# Patient Record
Sex: Female | Born: 1990 | Hispanic: Yes | Marital: Married | Smoking: Former smoker
Health system: Southern US, Community
[De-identification: ages and names within clinical notes are randomized; demographics above are authoritative.]

## PROBLEM LIST (undated history)

## (undated) DIAGNOSIS — E669 Obesity, unspecified: Secondary | ICD-10-CM

## (undated) DIAGNOSIS — E66811 Obesity, class 1: Secondary | ICD-10-CM

## (undated) DIAGNOSIS — R87629 Unspecified abnormal cytological findings in specimens from vagina: Secondary | ICD-10-CM

## (undated) DIAGNOSIS — R569 Unspecified convulsions: Secondary | ICD-10-CM

## (undated) DIAGNOSIS — K8020 Calculus of gallbladder without cholecystitis without obstruction: Secondary | ICD-10-CM

## (undated) HISTORY — PX: NASAL SEPTUM SURGERY: SHX37

## (undated) HISTORY — DX: Unspecified abnormal cytological findings in specimens from vagina: R87.629

## (undated) HISTORY — DX: Unspecified convulsions: R56.9

---
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
## 2011-04-21 HISTORY — PX: NASAL SEPTUM SURGERY: SHX37

## 2015-02-27 LAB — HM PAP SMEAR: HM Pap smear: NEGATIVE

## 2015-03-21 DIAGNOSIS — Z3402 Encounter for supervision of normal first pregnancy, second trimester: Secondary | ICD-10-CM | POA: Insufficient documentation

## 2015-03-21 DIAGNOSIS — Z8669 Personal history of other diseases of the nervous system and sense organs: Secondary | ICD-10-CM | POA: Insufficient documentation

## 2015-06-12 LAB — HM HIV SCREENING LAB: HM HIV Screening: NEGATIVE

## 2015-12-20 ENCOUNTER — Inpatient Hospital Stay: Admit: 2015-12-20 | Payer: Self-pay

## 2018-10-11 ENCOUNTER — Emergency Department
Admission: EM | Admit: 2018-10-11 | Discharge: 2018-10-11 | Disposition: A | Discharge status: Home / Self Care | Payer: HRSA Program | Attending: Emergency Medicine | Admitting: Emergency Medicine

## 2018-10-11 ENCOUNTER — Other Ambulatory Visit: Payer: Self-pay

## 2018-10-11 ENCOUNTER — Emergency Department: Payer: HRSA Program

## 2018-10-11 ENCOUNTER — Encounter: Payer: Self-pay | Admitting: Emergency Medicine

## 2018-10-11 DIAGNOSIS — R059 Cough, unspecified: Secondary | ICD-10-CM

## 2018-10-11 DIAGNOSIS — R05 Cough: Secondary | ICD-10-CM

## 2018-10-11 DIAGNOSIS — R0600 Dyspnea, unspecified: Secondary | ICD-10-CM

## 2018-10-11 DIAGNOSIS — R0602 Shortness of breath: Secondary | ICD-10-CM | POA: Diagnosis present

## 2018-10-11 DIAGNOSIS — U071 COVID-19: Secondary | ICD-10-CM | POA: Insufficient documentation

## 2018-10-11 LAB — BASIC METABOLIC PANEL
Anion gap: 7 (ref 5–15)
BUN: 11 mg/dL (ref 6–20)
CO2: 24 mmol/L (ref 22–32)
Calcium: 8.8 mg/dL — ABNORMAL LOW (ref 8.9–10.3)
Chloride: 110 mmol/L (ref 98–111)
Creatinine, Ser: 0.65 mg/dL (ref 0.44–1.00)
GFR calc Af Amer: >60 mL/min (ref >60)
GFR calc non Af Amer: >60 mL/min (ref >60)
Glucose, Bld: 106 mg/dL — ABNORMAL HIGH (ref 70–99)
Potassium: 3.8 mmol/L (ref 3.5–5.1)
Sodium: 141 mmol/L (ref 135–145)

## 2018-10-11 LAB — CBC
HCT: 39 % (ref 36.0–46.0)
Hemoglobin: 12.9 g/dL (ref 12.0–15.0)
MCH: 29.9 pg (ref 26.0–34.0)
MCHC: 33.1 g/dL (ref 30.0–36.0)
MCV: 90.3 fL (ref 80.0–100.0)
Platelets: 238 10*3/uL (ref 150–400)
RBC: 4.32 MIL/uL (ref 3.87–5.11)
RDW: 13.2 % (ref 11.5–15.5)
WBC: 5.5 10*3/uL (ref 4.0–10.5)
nRBC: 0 % (ref 0.0–0.2)

## 2018-10-11 LAB — TROPONIN I (HIGH SENSITIVITY): Troponin I (High Sensitivity): <2 ng/L (ref <18)

## 2018-10-11 MED ORDER — SODIUM CHLORIDE 0.9% FLUSH: 3.0000 mL | Freq: Once | INTRAVENOUS | Status: DC

## 2018-10-11 NOTE — ED Provider Notes (Signed)
[facility] Emergency Department Provider Note       Time seen: ----------------------------------------- 10:00 PM on 10/11/2018 -----------------------------------------   I have reviewed the triage vital signs and the nursing notes.  HISTORY   Chief Complaint Shortness of Breath, Cough, and Exposure to COVID-19    HPI Danielle Ball is a 28 y.o. female with no significant past medical history who presents to the ED for chest pressure for the last week and shortness of breath started yesterday.  Patient reports dry cough and other cold symptoms that started yesterday.  She states her husband was tested for COVID and received a positive result on Thursday but has been isolated since him since the positive result.  History reviewed. No pertinent past medical history.  There are no active problems to display for this patient.   Past Surgical History:  Procedure Laterality Date  . NASAL SEPTUM SURGERY      Allergies Metoclopramide  Social History Social History   Tobacco Use  . Smoking status: Never Smoker  . Smokeless tobacco: Never Used  Substance Use Topics  . Alcohol use: Not on file  . Drug use: Not on file   Review of Systems Constitutional: Negative for fever. Cardiovascular: Positive for chest pain Respiratory: Positive for shortness of breath, cough Gastrointestinal: Negative for abdominal pain, vomiting and diarrhea. Musculoskeletal: Negative for back pain. Skin: Negative for rash. Neurological: Negative for headaches, focal weakness or numbness.  All systems negative/normal/unremarkable except as stated in the HPI  ____________________________________________   PHYSICAL EXAM:  VITAL SIGNS: ED Triage Vitals  Enc Vitals Group     BP 10/11/18 1905 114/66     Pulse Rate 10/11/18 1905 95     Resp 10/11/18 1905 18     Temp 10/11/18 1905 97.9 F (36.6 C)     Temp Source 10/11/18 1905 Oral     SpO2 10/11/18 1905 100  %     Weight 10/11/18 1919 132 lb 4.4 oz (60 kg)     Height 10/11/18 1919 4' 11.84" (1.52 m)     Head Circumference --      Peak Flow --      Pain Score 10/11/18 1913 4     Pain Loc --      Pain Edu? --      Excl. in GC? --    Constitutional: Alert and oriented. Well appearing and in no distress. Eyes: Conjunctivae are normal. Normal extraocular movements. ENT      Head: Normocephalic and atraumatic.      Nose: No congestion/rhinnorhea.      Mouth/Throat: Mucous membranes are moist.      Neck: No stridor. Cardiovascular: Normal rate, regular rhythm. No murmurs, rubs, or gallops. Respiratory: Normal respiratory effort without tachypnea nor retractions. Breath sounds are clear and equal bilaterally. No wheezes/rales/rhonchi. Gastrointestinal: Soft and nontender. Normal bowel sounds Musculoskeletal: Nontender with normal range of motion in extremities. No lower extremity tenderness nor edema. Neurologic:  Normal speech and language. No gross focal neurologic deficits are appreciated.  Skin:  Skin is warm, dry and intact. No rash noted. Psychiatric: Mood and affect are normal. Speech and behavior are normal.  ____________________________________________  EKG: Interpreted by me.  Sinus rhythm the rate of 77 bpm, normal PR interval, normal QRS, normal QT  ____________________________________________  ED COURSE:  As part of my medical decision making, I reviewed the following data within the electronic MEDICAL RECORD NUMBER History obtained from family if available, nursing notes, old chart  and ekg, as well as notes from prior ED visits. Patient presented for chest pain and shortness of breath, we will assess with labs and imaging as indicated at this time.   Procedures  Yarima L Classie Weng was evaluated in Emergency Department on 10/11/2018 for the symptoms described in the history of present illness. She was evaluated in the context of the global COVID-19 pandemic, which necessitated  consideration that the patient might be at risk for infection with the SARS-CoV-2 virus that causes COVID-19. Institutional protocols and algorithms that pertain to the evaluation of patients at risk for COVID-19 are in a state of rapid change based on information released by regulatory bodies including the CDC and federal and state organizations. These policies and algorithms were followed during the patient's care in the ED.  ____________________________________________   LABS (pertinent positives/negatives)  Labs Reviewed  BASIC METABOLIC PANEL - Abnormal; Notable for the following components:      Result Value   Glucose, Bld 106 (*)    Calcium 8.8 (*)    All other components within normal limits  NOVEL CORONAVIRUS, NAA (HOSPITAL ORDER, SEND-OUT TO REF LAB)  CBC  TROPONIN I (HIGH SENSITIVITY)  TROPONIN I (HIGH SENSITIVITY)  POC URINE PREG, ED    RADIOLOGY Images were viewed by me Chest x-ray Did not reveal any acute process  ____________________________________________   DIFFERENTIAL DIAGNOSIS   Coronavirus, viral illness, pneumonia, anxiety, seasonal allergy  FINAL ASSESSMENT AND PLAN  Chest pain, shortness of breath   Plan: The patient had presented for likely coronavirus as her husband is COVID-19 positive. Patient's labs are normal, we have sent in the outpatient coronavirus test. Patient's imaging was reassuring.  She is cleared for outpatient follow-up.   Laurence Aly, MD    Note: This note was generated in part or whole with voice recognition software. Voice recognition is usually quite accurate but there are transcription errors that can and very often do occur. I apologize for any typographical errors that were not detected and corrected.     Earleen Newport, MD 10/11/18 2236

## 2018-10-11 NOTE — ED Notes (Signed)
Patient  AAOX4. Vitals Stable. NAD. Interpretor used for discharge instructions.

## 2018-10-11 NOTE — ED Notes (Signed)
ED Provider at bedside with portable interpreter

## 2018-10-11 NOTE — ED Triage Notes (Signed)
Pt arrived via POV with reports of chest pressure x 1 week  and shortness of breath that started yesterday. Pt also reports dry cough and other cold sxs that started yesterday. Pt denies any fevers.  Pt states her husband was tested for COVID and received positive result on Thursday and has been isolated from husband since receiving a positive result.

## 2018-10-13 ENCOUNTER — Telehealth: Payer: Self-pay | Admitting: Emergency Medicine

## 2018-10-13 NOTE — Telephone Encounter (Signed)
Called patient via pacific interpretter [phone] and informed of positive covid 19 rsult.  Answered any questions according to cdc guidelines.

## 2018-10-14 LAB — NOVEL CORONAVIRUS, NAA (HOSP ORDER, SEND-OUT TO REF LAB; TAT 18-24 HRS): SARS-CoV-2, NAA: DETECTED — AB

## 2018-11-03 ENCOUNTER — Telehealth: Payer: Self-pay | Admitting: Family Medicine

## 2018-11-03 NOTE — Telephone Encounter (Signed)
was told to call back to schedule appt for nexplanon removal

## 2018-11-03 NOTE — Telephone Encounter (Signed)
TC to patient. Patient wants Nexplanon removed. She states she had it put in 3 years ago, and wants to let her body "rest" and desires pregnancy. Patient scheduled for nexplanon removal on 11/11/18. Interpreter M. [place]Jenetta Downer, RN

## 2018-11-11 ENCOUNTER — Ambulatory Visit (LOCAL_COMMUNITY_HEALTH_CENTER): Payer: Self-pay | Admitting: Physician Assistant

## 2018-11-11 ENCOUNTER — Other Ambulatory Visit: Payer: Self-pay

## 2018-11-11 VITALS — BP 116/75 | Ht 61.0 in | Wt 160.4 lb

## 2018-11-11 DIAGNOSIS — Z113 Encounter for screening for infections with a predominantly sexual mode of transmission: Secondary | ICD-10-CM

## 2018-11-11 DIAGNOSIS — Z3009 Encounter for other general counseling and advice on contraception: Secondary | ICD-10-CM

## 2018-11-11 DIAGNOSIS — Z3046 Encounter for surveillance of implantable subdermal contraceptive: Secondary | ICD-10-CM

## 2018-11-11 NOTE — Progress Notes (Signed)
Here for Nexplanon removal. Desires pregnancy. Wants HIV testing today.   Hal Morales, RN

## 2018-11-11 NOTE — Progress Notes (Signed)
Nexplanon Removal Patient identified, informed consent performed, consent signed.   Appropriate time out taken. Nexplanon site identified.  Area prepped in usual sterile fashon. 3 ml of 1% lidocaine with Epinephrine was used to anesthetize the area at the distal end of the implant and along implant site. A small stab incision was made right beside the implant on the distal portion.  The Nexplanon rod was grasped using curved hemostats and removed without difficulty.  There was minimal blood loss. There were no complications.  Steri-strips were applied over the small incision.  A pressure bandage was applied to reduce any bruising.  The patient tolerated the procedure well and was given post procedure instructions.    Patient counseled to start OTC analgesic as soon as lidocaine starts to wear off an take for at least 48 hr with food or milk to decrease discomfort.  Specifically:  IB 600 mg( 3 tablets) every 6 hrs or 800 mg (4 tablets) every 8 hrs; Aleve 2 tablets every 12 hrs. Patient requests HIV and RPR testing today and is sent to the lab after the removal procedure and counseled to leave after the lab has drawn her blood and that a RN will call her if she needs to RTC once her results are back.    Visit performed using language line interpreter, Liliana # [id number] for 27 minutes.

## 2019-01-23 ENCOUNTER — Other Ambulatory Visit: Payer: Self-pay

## 2019-01-23 ENCOUNTER — Ambulatory Visit (LOCAL_COMMUNITY_HEALTH_CENTER): Payer: Self-pay

## 2019-01-23 VITALS — BP 114/77 | Ht 60.75 in | Wt 166.5 lb

## 2019-01-23 DIAGNOSIS — Z3201 Encounter for pregnancy test, result positive: Secondary | ICD-10-CM

## 2019-01-23 LAB — PREGNANCY, URINE: Preg Test, Ur: POSITIVE — AB

## 2019-01-23 MED ORDER — PRENATAL VITAMIN 27-0.8 MG PO TABS: 1.0000 | ORAL_TABLET | Freq: Every day | ORAL | 0 refills | Status: AC

## 2019-01-23 NOTE — Progress Notes (Signed)
Pt desires prenatal care at [facility]; sent to preadmit. Used [employer], Rep # [id number].

## 2019-02-14 ENCOUNTER — Telehealth: Payer: Self-pay | Admitting: Family Medicine

## 2019-02-14 NOTE — Telephone Encounter (Signed)
TC to patient who complains of bleeding since last Saturday. She states she is 2 months pregnant. She has appointment scheduled for new OB on 03/06/2019. Patient counseled to go to ED and be evaluated. Patient counseled that there is nothing we can do for her at this time and she should be evaluated at the ED for her concerns. Patient states understanding. Interpreter: [employer] [phone].Jenetta Downer, RN

## 2019-02-14 NOTE — Telephone Encounter (Signed)
Patient has been bleeding since Saturday and wants to know why and if that's normal.

## 2019-02-15 ENCOUNTER — Emergency Department: Payer: Self-pay

## 2019-02-15 ENCOUNTER — Emergency Department
Admission: EM | Admit: 2019-02-15 | Discharge: 2019-02-15 | Disposition: A | Discharge status: Home / Self Care | Payer: Self-pay | Attending: Emergency Medicine | Admitting: Emergency Medicine

## 2019-02-15 ENCOUNTER — Other Ambulatory Visit: Payer: Self-pay

## 2019-02-15 DIAGNOSIS — Z79899 Other long term (current) drug therapy: Secondary | ICD-10-CM | POA: Insufficient documentation

## 2019-02-15 DIAGNOSIS — O209 Hemorrhage in early pregnancy, unspecified: Secondary | ICD-10-CM | POA: Insufficient documentation

## 2019-02-15 DIAGNOSIS — Z3491 Encounter for supervision of normal pregnancy, unspecified, first trimester: Secondary | ICD-10-CM

## 2019-02-15 DIAGNOSIS — O4690 Antepartum hemorrhage, unspecified, unspecified trimester: Secondary | ICD-10-CM

## 2019-02-15 DIAGNOSIS — Z3A08 8 weeks gestation of pregnancy: Secondary | ICD-10-CM | POA: Insufficient documentation

## 2019-02-15 DIAGNOSIS — Z3201 Encounter for pregnancy test, result positive: Secondary | ICD-10-CM | POA: Insufficient documentation

## 2019-02-15 LAB — URINALYSIS, COMPLETE (UACMP) WITH MICROSCOPIC
Bacteria, UA: NONE SEEN
Bilirubin Urine: NEGATIVE
Glucose, UA: NEGATIVE mg/dL
Ketones, ur: NEGATIVE mg/dL
Leukocytes,Ua: NEGATIVE
Nitrite: NEGATIVE
Protein, ur: NEGATIVE mg/dL
Specific Gravity, Urine: 1.004 — ABNORMAL LOW (ref 1.005–1.030)
pH: 8 (ref 5.0–8.0)

## 2019-02-15 LAB — CBC
HCT: 40.6 % (ref 36.0–46.0)
Hemoglobin: 13.3 g/dL (ref 12.0–15.0)
MCH: 29.8 pg (ref 26.0–34.0)
MCHC: 32.8 g/dL (ref 30.0–36.0)
MCV: 91 fL (ref 80.0–100.0)
Platelets: 295 10*3/uL (ref 150–400)
RBC: 4.46 MIL/uL (ref 3.87–5.11)
RDW: 13.5 % (ref 11.5–15.5)
WBC: 7.2 10*3/uL (ref 4.0–10.5)
nRBC: 0 % (ref 0.0–0.2)

## 2019-02-15 LAB — ABO/RH: ABO/RH(D): A POS

## 2019-02-15 LAB — POCT PREGNANCY, URINE: Preg Test, Ur: POSITIVE — AB

## 2019-02-15 LAB — HCG, QUANTITATIVE, PREGNANCY: hCG, Beta Chain, Quant, S: 34050 m[IU]/mL — ABNORMAL HIGH (ref <5)

## 2019-02-15 NOTE — ED Provider Notes (Signed)
[facility] Emergency Department Provider Note  ____________________________________________   First MD Initiated Contact with Patient 02/15/19 1039     (approximate)  I have reviewed the triage vital signs and the nursing notes.   HISTORY  Chief Complaint Vaginal Bleeding    HPI Danielle Ball is a 28 y.o. female  Here with vaginal bleeding. Pt states that she is currently an estimated [redacted] weeks pregnant. She states that for the past 3 days, she has noticed a small amount of blood on her underwear when she wipes. No significant bleeding or clots. She has also had an occasional LLQ pain that is not severe. She states she had some mild urinary frequency 2 days ago but no persistent dysuria or other symptoms. No fever, chills. She is G2P1001 with no h/o prior complications during pregnancy. No vaginal discharge.         Past Medical History:  Diagnosis Date  . Seizures [facility](HCC)    Last age 1    There are no active problems to display for this patient.   Past Surgical History:  Procedure Laterality Date  . EYE SURGERY Right 1994  . HERNIA REPAIR  1998  . NASAL SEPTUM SURGERY      Prior to Admission medications   Medication Sig Start Date End Date Taking? Authorizing Provider  etonogestrel (NEXPLANON) 68 MG IMPL implant 1 each by Subdermal route once. 11/11/15   Sciora, Austin MilesElizabeth A, CNM  Prenatal Vit-Fe Fumarate-FA (PRENATAL VITAMIN) 27-0.8 MG TABS Take 1 tablet by mouth daily. 01/23/19 05/03/19  Federico FlakeNewton, Kimberly Niles, MD    Allergies Metoclopramide  Family History  Problem Relation Age of Onset  . Stomach cancer Maternal Grandmother   . Diabetes Maternal Grandfather     Social History Social History   Tobacco Use  . Smoking status: Never Smoker  . Smokeless tobacco: Never Used  Substance Use Topics  . Alcohol use: Not Currently  . Drug use: Never    Review of Systems  Review of Systems  Constitutional: Negative for fatigue  and fever.  HENT: Negative for congestion and sore throat.   Eyes: Negative for visual disturbance.  Respiratory: Negative for cough and shortness of breath.   Cardiovascular: Negative for chest pain.  Gastrointestinal: Negative for abdominal pain, diarrhea, nausea and vomiting.  Genitourinary: Positive for vaginal bleeding. Negative for flank pain.  Musculoskeletal: Negative for back pain and neck pain.  Skin: Negative for rash and wound.  Neurological: Negative for weakness.  All other systems reviewed and are negative.    ____________________________________________  PHYSICAL EXAM:      VITAL SIGNS: ED Triage Vitals [02/15/19 0849]  Enc Vitals Group     BP 123/81     Pulse Rate 86     Resp 16     Temp 98.2 F (36.8 C)     Temp Source Oral     SpO2 100 %     Weight 160 lb (72.6 kg)     Height 4' 11.06" (1.5 m)     Head Circumference      Peak Flow      Pain Score 0     Pain Loc      Pain Edu?      Excl. in GC?      Physical Exam Vitals signs and nursing note reviewed.  Constitutional:      General: She is not in acute distress.    Appearance: She is well-developed.  HENT:  Head: Normocephalic and atraumatic.  Eyes:     Conjunctiva/sclera: Conjunctivae normal.  Neck:     Musculoskeletal: Neck supple.  Cardiovascular:     Rate and Rhythm: Normal rate and regular rhythm.     Heart sounds: Normal heart sounds. No murmur. No friction rub.  Pulmonary:     Effort: Pulmonary effort is normal. No respiratory distress.     Breath sounds: Normal breath sounds. No wheezing or rales.  Abdominal:     General: There is no distension.     Palpations: Abdomen is soft.     Tenderness: There is no abdominal tenderness.     Comments: Uterus non palpable. No TTP. No rebound or guarding. No CVAT.  Skin:    General: Skin is warm.     Capillary Refill: Capillary refill takes less than 2 seconds.  Neurological:     Mental Status: She is alert and oriented to person, place,  and time.     Motor: No abnormal muscle tone.       ____________________________________________   LABS (all labs ordered are listed, but only abnormal results are displayed)  Labs Reviewed  HCG, QUANTITATIVE, PREGNANCY - Abnormal; Notable for the following components:      Result Value   hCG, Beta Chain, Quant, S 34,050 (*)    All other components within normal limits  URINALYSIS, COMPLETE (UACMP) WITH MICROSCOPIC - Abnormal; Notable for the following components:   Color, Urine COLORLESS (*)    APPearance CLEAR (*)    Specific Gravity, Urine 1.004 (*)    Hgb urine dipstick SMALL (*)    All other components within normal limits  POCT PREGNANCY, URINE - Abnormal; Notable for the following components:   Preg Test, Ur POSITIVE (*)    All other components within normal limits  CBC  POC URINE PREG, ED  ABO/RH    ____________________________________________  EKG: None ________________________________________  RADIOLOGY All imaging, including plain films, CT scans, and ultrasounds, independently reviewed by me, and interpretations confirmed via formal radiology reads.  ED MD interpretation:   U/S: IUP, unable to confirm viability, small subchorion hemorrhage, L ovarian cyst  Official radiology report(s): US Ob Less Than 14 Weeks With Ob Transvaginal  Result Date: 02/15/2019 CLINICAL DATA:  Vaginal bleeding EXAM: OBSTETRIC <14 WK [place] AND TRANSVAGINAL OB US TECHNIQUE: Both transabdominal and transvaginal ultrasound examinations were performed for complete evaluation of the gestation as well as the maternal uterus, adnexal regions, and pelvic cul-de-sac. Transvaginal technique was performed to assess early pregnancy. COMPARISON:  None. FINDINGS: Intrauterine gestational sac: Single.  Somewhat irregularly shaped. Yolk sac:  Visualized. Embryo:  Not Visualized. Cardiac Activity: Not Visualized. MSD: 25.2 mm   7 w   4 d [place] EDC: 09/30/2019 Subchorionic hemorrhage: Small subchorionic  hemorrhage along the anteroinferior margin of the gestational sac measuring 1.7 x 0.7 cm. Maternal uterus/adnexae: Small hypoechoic intramural fibroid along the posterior uterine body measuring up to 0.8 cm. Probable corpus luteal cyst noted within the right ovary. Left ovary appears unremarkable. Probable left paraovarian cyst with possible retracted internal hemorrhagic component. No free fluid within the cul-de-sac. IMPRESSION: 1. Probable early intrauterine gestational sac with yolk sac, but no fetal pole or cardiac activity yet visualized. Gestational sac is slightly irregularly shaped with a small adjacent subchorionic hemorrhage. Recommend follow-up quantitative B-HCG levels and follow-up US in 14 days to assess viability. This recommendation follows SRU consensus guidelines: Diagnostic Criteria for Nonviable Pregnancy Early in the First Trimester. Malva Limes Med 2013; 937:9024-09.  2. Likely hemorrhagic left paraovarian cyst. 3. Probable right corpus luteal cyst. Electronically Signed   By: Davina Poke M.D.   On: 02/15/2019 12:03    ____________________________________________  PROCEDURES   Procedure(s) performed (including Critical Care):  Procedures  ____________________________________________  INITIAL IMPRESSION / MDM / [place] / ED COURSE  As part of my medical decision making, I reviewed the following data within the electronic MEDICAL RECORD NUMBER Notes from prior ED visits and [place] Controlled Substance Database      *Danielle Ball was evaluated in Emergency Department on 02/15/2019 for the symptoms described in the history of present illness. She was evaluated in the context of the global COVID-19 pandemic, which necessitated consideration that the patient might be at risk for infection with the SARS-CoV-2 virus that causes COVID-19. Institutional protocols and algorithms that pertain to the evaluation of patients at risk for COVID-19 are in a state of rapid  change based on information released by regulatory bodies including the CDC and federal and state organizations. These policies and algorithms were followed during the patient's care in the ED.  Some ED evaluations and interventions may be delayed as a result of limited staffing during the pandemic.*      Medical Decision Making:  28 yo G2P1 at estimated [redacted] wk GA here with scant vaginal bleeding. Imaging shows small subchorion hemorrhage which likely explains her bleeding. She Is Rh+. No active bleeding, Hgb is reassuring. No signs of UTI. No sx of PID. Of note, cannot confirm viability of pregnancy on U/S so will refer to [facility] for repeat. Return precautions given. She is taking PNVs.  ____________________________________________  FINAL CLINICAL IMPRESSION(S) / ED DIAGNOSES  Final diagnoses:  Vaginal bleeding in pregnancy  First trimester pregnancy     MEDICATIONS GIVEN DURING THIS VISIT:  Medications - No data to display   ED Discharge Orders    None       Note:  This document was prepared using Dragon voice recognition software and may include unintentional dictation errors.   Duffy Bruce, MD 02/15/19 [phone]

## 2019-02-15 NOTE — ED Triage Notes (Signed)
[employer] interpreter services used for triage.   Pt to ER c/o vaginal bleeding that started Saturday and has continued throughout today when wiping. Pt reports that she is 2 months pregnant.  Denies pelvic or abdominal pain. Pt alert and oriented X4, cooperative, RR even and unlabored, color WNL. Pt in NAD.

## 2019-02-27 ENCOUNTER — Telehealth: Payer: Self-pay

## 2019-02-27 ENCOUNTER — Other Ambulatory Visit: Payer: Medicaid Other

## 2019-02-27 ENCOUNTER — Other Ambulatory Visit: Payer: Self-pay | Admitting: Family Medicine

## 2019-02-27 ENCOUNTER — Other Ambulatory Visit: Payer: Self-pay

## 2019-02-27 DIAGNOSIS — O4690 Antepartum hemorrhage, unspecified, unspecified trimester: Secondary | ICD-10-CM

## 2019-02-27 DIAGNOSIS — O039 Complete or unspecified spontaneous abortion without complication: Secondary | ICD-10-CM | POA: Diagnosis not present

## 2019-02-27 DIAGNOSIS — O3680X0 Pregnancy with inconclusive fetal viability, not applicable or unspecified: Secondary | ICD-10-CM

## 2019-02-27 NOTE — Telephone Encounter (Signed)
Via interpreter V. Olmedo, call to client-discussed Dr. Ernestina Patches reviewed ED records from 10/289/20 and recommends B. Hcg be drawn and an order for f/u u/s has been placed.  Will come in this afternoon for blood drawn and will apply for presumptive Medicaid when in; reports has had no further bleeding since @ ED visit;  Reviewed if BRB or severe pain to go to ED.  Aware will be called for u/s scheduling Debera Lat, RN

## 2019-02-27 NOTE — Progress Notes (Signed)
Patient with scheduled IP but was seen for vaginal bleeding with beta HCG ~34K with absent fetal pole/uncertain viability.    Plan: Needs repeat US and bHCG  Recommend clinica visit on day of [place] to review results in person. Schedule as Maternity Problem visit.   Orders Placed This Encounter  Procedures  . US OB Comp Less 14 Wks    [place] on 10/28 did not show fetal pole, needs repeat in 10-14 days thus 11/7 or later.    Standing Status:   Future    Standing Expiration Date:   04/28/2020    Order Specific Question:   Reason for Exam (SYMPTOM  OR DIAGNOSIS REQUIRED)    Answer:   Viability    Order Specific Question:   Preferred Imaging Location?    Answer:   [facility]  . hCG, quantitative, pregnancy    Standing Status:   Future    Standing Expiration Date:   02/27/2020

## 2019-02-27 NOTE — Progress Notes (Signed)
Reports to Nurse Clinic for beta hcg and counseled [facility] will notify her of results / plan in 2 - 3 days. Jamas Lav [place] interpreted during call. Rich Number, RN

## 2019-02-28 ENCOUNTER — Telehealth: Payer: Self-pay

## 2019-02-28 LAB — BETA HCG QUANT (REF LAB): hCG Quant: 4191 m[IU]/mL

## 2019-02-28 NOTE — Telephone Encounter (Signed)
Call to client to notify her beta hcg result had decreasd from ~ 34,000 to ~ 4000 and [facility] IP appt for 03/06/2019 cancelled. Client counseled regarding need for follow-up US which is scheduled for 03/03/2019 at 1 pm at [facility] (arrive 12:30 pm). Per order of Dr. Ernestina Patches, client needs follow-up appt at [place] same day as [place]. Appt scheduled for 03/03/2019 at 4 pm (arrive 3:45 pm). Rich Number, RN

## 2019-03-03 ENCOUNTER — Other Ambulatory Visit: Payer: Self-pay

## 2019-03-03 ENCOUNTER — Ambulatory Visit
Admission: RE | Admit: 2019-03-03 | Discharge: 2019-03-03 | Disposition: A | Discharge status: Home / Self Care | Payer: Self-pay | Source: Ambulatory Visit | Attending: Family Medicine | Admitting: Family Medicine

## 2019-03-03 ENCOUNTER — Other Ambulatory Visit: Payer: Self-pay | Admitting: Family Medicine

## 2019-03-03 ENCOUNTER — Ambulatory Visit: Payer: Medicaid Other | Admitting: Family Medicine

## 2019-03-03 VITALS — BP 129/84 | Wt 166.5 lb

## 2019-03-03 DIAGNOSIS — O039 Complete or unspecified spontaneous abortion without complication: Secondary | ICD-10-CM

## 2019-03-03 DIAGNOSIS — O4690 Antepartum hemorrhage, unspecified, unspecified trimester: Secondary | ICD-10-CM | POA: Insufficient documentation

## 2019-03-03 DIAGNOSIS — O3680X0 Pregnancy with inconclusive fetal viability, not applicable or unspecified: Secondary | ICD-10-CM | POA: Insufficient documentation

## 2019-03-03 MED ORDER — MISOPROSTOL 200 MCG PO TABS: 800.0000 ug | ORAL_TABLET | Freq: Once | ORAL | 1 refills | Status: DC

## 2019-03-03 MED ORDER — ONDANSETRON 4 MG PO TBDP: 4.0000 mg | ORAL_TABLET | Freq: Four times a day (QID) | ORAL | 0 refills | Status: DC | PRN

## 2019-03-03 NOTE — Patient Instructions (Signed)
There are three options for managing a miscarriage: 1) Expectant Management: This is where you wait for 2 weeks to see if the body passes the pregnancy on its own 2) Cytotec (Misoprostol): this is a medication that causes cramping of the uterus and can help passage of the pregnancy.  3) Dilation and curettage: this is a surgical procedure to remove the pregnancy from the uterus   You have decided to do EXPECTANT MANAGEMENT   We will see you in 2 weeks to confirm passage of the pregnancy or to help you with passage of your pregnancy. You were given a prescription for 3 medications. Two were sent to your pharmacy (Ibuprofen which is for pain and zofran which is for nausea). You were given a paper prescription for Percocet which is a strong pain medication you can use for severe cramping.  If you decide you would like to have Cytotec please call the office and we will send this to the pharmacy for you.    You need to call or go to the emergency room for: -bleeding that fills up 1 pad per hour -Severe abdominal pain -Dizziness/lightheadedness -passing out Or any medical concern      

## 2019-03-03 NOTE — Progress Notes (Signed)
  OB PROBLEM  VISIT ENCOUNTER NOTE  Subjective:   Danielle Ball Rhyse Skowron is a 28 y.o. G64P1001 female here for a a problem OB visit.  Current complaints: vaginal bleeding- seen at [facility] on 02/15/2019 where bHCG was 34K qnt and [place] with no fetal pole. Repeat HBCG was 4K and repeat US performed 03/03/19 showed no fetal pole which meets criteria for failed pregnancy  Denies abnormal vaginal bleeding, discharge, pelvic pain, problems with intercourse or other gynecologic concerns.      Health Maintenance Due  Topic Date Due  . TETANUS/TDAP  04/14/2008  . PAP-Cervical Cytology Screening  02/26/2018  . PAP SMEAR-Modifier  02/26/2018  . INFLUENZA VACCINE  11/19/2018     The following portions of the patient's history were reviewed and updated as appropriate: allergies, current medications, past family history, past medical history, past social history, past surgical history and problem list.  Review of Systems Pertinent items are noted in HPI.   Objective:  BP 129/84   Wt 166 lb 8 oz (75.5 kg)   LMP 12/17/2018 (Exact Date) Comment: normal  BMI 33.57 kg/m  Gen: well appearing, NAD HEENT: no scleral icterus CV: RR Lung: Normal WOB Ext: warm well perfused   Assessment and Plan:  1. Miscarriage With falling bHCG and repeat US patient meets criteria for failed pregnancy. Reviewed options given definitive failed pregnancy- options included expectant management, cytotec and DC/MVA. Patient opted for  - Repeat bhcg in 2 weeks, plan to follow to <5.  - Pt desires pregnancy in next year and counseled on taking PNV and likelihood of normal pregnancy - ondansetron (ZOFRAN ODT) 4 MG disintegrating tablet; Take 1 tablet (4 mg total) by mouth every 6 (six) hours as needed for nausea.  Dispense: 20 tablet; Refill: 0 - misoprostol (CYTOTEC) 200 MCG tablet; Place 4 tablets (800 mcg total) vaginally once for 1 dose. Place two tablets in the vagina the night prior to your next clinic appointment   Dispense: 4 tablet; Refill: 1 - Beta hCG quant (ref lab); Future   Please refer to After Visit Summary for other counseling recommendations.   Return in about 2 weeks (around 03/17/2019) for bHCG, LAB.   Caren Macadam, MD, MPH, ABFM

## 2019-03-10 ENCOUNTER — Telehealth: Payer: Self-pay

## 2019-03-10 NOTE — Telephone Encounter (Signed)
Returned call-scheduled office visit 03/20/19 Debera Lat, RN

## 2019-03-10 NOTE — Telephone Encounter (Signed)
Per Dr. Magda Kiel needs 2 wk f/u appt.-attempted to call via interpreter V. Olmedo; no answer & no voicemail set up; need visit ~03/20/19.; Debera Lat, RN

## 2019-03-20 ENCOUNTER — Encounter: Payer: Self-pay | Admitting: Advanced Practice Midwife

## 2019-03-20 ENCOUNTER — Other Ambulatory Visit: Payer: Self-pay

## 2019-03-20 ENCOUNTER — Ambulatory Visit: Payer: Medicaid Other | Admitting: Advanced Practice Midwife

## 2019-03-20 VITALS — BP 127/72 | Wt 170.6 lb

## 2019-03-20 DIAGNOSIS — O039 Complete or unspecified spontaneous abortion without complication: Secondary | ICD-10-CM | POA: Diagnosis not present

## 2019-03-20 DIAGNOSIS — O034 Incomplete spontaneous abortion without complication: Secondary | ICD-10-CM | POA: Diagnosis not present

## 2019-03-20 NOTE — Progress Notes (Signed)
28 yo G2P1 with 02/15/19 ER u/s with yolk sac measuring 7 4/7 wks, no fetal pole, Bhcg=34,050.  02/27/19 Bhcg=4,191.  03/03/19 u/s with 7 3/7 wk sac and no fetal pole, Bhcg=4,191.  Pt given cytotec 4 tablets (800 mcg total) x1 by Dr. Ernestina Patches on 03/04/19.  Presents with pink spotting and sl cramping. Has not had sex and wants condoms only.  Bhcg today.  Pt counseled on abstinance until Bhcg gets to 0--pt agrees.  RTC 03/28/19 for Bhcg

## 2019-03-20 NOTE — Progress Notes (Signed)
Patient here after miscarriage. Needs BHcg today. States she has not been having sex for the past several months, and plans to use condoms when she does, but wants to get pregnant when she is done with appointments for her miscarriage and she gets the ok from provider.Jenetta Downer, RN

## 2019-03-21 LAB — BETA HCG QUANT (REF LAB): hCG Quant: 125 m[IU]/mL

## 2019-03-28 ENCOUNTER — Other Ambulatory Visit: Payer: Self-pay

## 2019-03-28 ENCOUNTER — Other Ambulatory Visit (LOCAL_COMMUNITY_HEALTH_CENTER): Payer: Medicaid Other

## 2019-03-28 ENCOUNTER — Ambulatory Visit: Payer: Self-pay

## 2019-03-28 DIAGNOSIS — O034 Incomplete spontaneous abortion without complication: Secondary | ICD-10-CM

## 2019-03-28 NOTE — Progress Notes (Signed)
Pt to clinic for Beta HCG labs due to miscarriage. Pt sent to lab for Beta HCG lab per Donnal Moat, CNM order dated 03/20/2019.

## 2019-03-29 LAB — BETA HCG QUANT (REF LAB): hCG Quant: 79 m[IU]/mL

## 2019-03-30 ENCOUNTER — Telehealth: Payer: Self-pay

## 2019-03-30 NOTE — Telephone Encounter (Signed)
Call to client via interpreter V. Olmedo; per Ola Spurr, CNM-discussed decreasing B. Hcg and to f/u in ~1 wk-appt. Scheduled 04/05/19 for lab; to call with problems/?'s-reports feeling good; Debera Lat, RN

## 2019-04-05 ENCOUNTER — Other Ambulatory Visit: Payer: Self-pay

## 2019-04-06 ENCOUNTER — Other Ambulatory Visit: Payer: Self-pay

## 2019-04-06 ENCOUNTER — Other Ambulatory Visit: Payer: Medicaid Other

## 2019-04-06 DIAGNOSIS — O034 Incomplete spontaneous abortion without complication: Secondary | ICD-10-CM | POA: Diagnosis not present

## 2019-04-06 NOTE — Progress Notes (Signed)
Pt to RN clinic for follow-up Beta HCG, per Donnal Moat, CNM order dated 03/20/2019. Pt counseled that we will call her about results and instructions when results come in; pt sent to lab.

## 2019-04-07 ENCOUNTER — Other Ambulatory Visit: Payer: Self-pay | Admitting: Family Medicine

## 2019-04-07 DIAGNOSIS — O034 Incomplete spontaneous abortion without complication: Secondary | ICD-10-CM

## 2019-04-07 LAB — BETA HCG QUANT (REF LAB): hCG Quant: 37 m[IU]/mL

## 2019-04-12 ENCOUNTER — Telehealth: Payer: Self-pay

## 2019-04-12 NOTE — Telephone Encounter (Signed)
Cal to pt. Via interpreter V. Olmedo-discussed hcg level dropping and need to f/u again-scheduled for lab 04/18/19. Debera Lat, RN

## 2019-04-18 ENCOUNTER — Other Ambulatory Visit: Payer: Medicaid Other

## 2019-04-18 ENCOUNTER — Other Ambulatory Visit: Payer: Self-pay

## 2019-04-18 DIAGNOSIS — O034 Incomplete spontaneous abortion without complication: Secondary | ICD-10-CM

## 2019-04-18 NOTE — Progress Notes (Signed)
Pt to RN clinic for lab only, follow-up Beta HCG. See Donnal Moat, CNM notes dated 03/20/2019 and additional order by Charlotte Sanes, PA dated 04/07/2019.

## 2019-04-19 LAB — BETA HCG QUANT (REF LAB): hCG Quant: 15 m[IU]/mL

## 2019-04-20 ENCOUNTER — Other Ambulatory Visit: Payer: Self-pay | Admitting: Family Medicine

## 2019-04-20 ENCOUNTER — Telehealth: Payer: Self-pay

## 2019-04-20 DIAGNOSIS — O039 Complete or unspecified spontaneous abortion without complication: Secondary | ICD-10-CM

## 2019-04-20 NOTE — Telephone Encounter (Signed)
Per order of Charlotte Sanes PA-C (attached to last beta hcg result), repeat beta hcg needed in one week. Call to client and appt scheduled for 04/27/2019 with arrival time of 1000. Rowland Lathe interpreted during call. Rich Number, RN

## 2019-04-20 NOTE — Progress Notes (Signed)
Beta hcg not yet 0. Pt to RTC in 1 wk for repeat beta hcg blood draw.

## 2019-04-27 ENCOUNTER — Other Ambulatory Visit: Payer: Self-pay

## 2019-05-01 ENCOUNTER — Telehealth: Payer: Self-pay

## 2019-05-01 NOTE — Telephone Encounter (Signed)
Call to client via interpeter V. Olmedo-has appt. Already scheduled for B. Hcg on 05/03/19.  Client requesting appt for Pap but declines BCM for now.  Discussed due to staffing shortages with Covid to call again 2-3 mths. For scheduling Pap/FP appt-agrees to plan Sharlette Dense, RN

## 2019-05-03 ENCOUNTER — Other Ambulatory Visit: Payer: Self-pay

## 2019-05-03 ENCOUNTER — Other Ambulatory Visit (LOCAL_COMMUNITY_HEALTH_CENTER): Payer: Self-pay

## 2019-05-03 DIAGNOSIS — O034 Incomplete spontaneous abortion without complication: Secondary | ICD-10-CM

## 2019-05-03 NOTE — Progress Notes (Signed)
Pt to RN clinic for Beta HCG; pt sent for test per Samara Snide, PA order dated 04/20/2019.

## 2019-05-04 LAB — BETA HCG QUANT (REF LAB): hCG Quant: 5 m[IU]/mL

## 2019-05-05 ENCOUNTER — Telehealth: Payer: Self-pay

## 2019-05-05 NOTE — Telephone Encounter (Signed)
Client notified beta hcg result = 5 and no longer needs to have level monitored. [employer] (ID # [id number]) assisted with call. Client without questions. Jossie Ng, RN

## 2019-05-31 NOTE — Addendum Note (Signed)
Addended by: Ann Held on: 05/31/2019 08:01 PM   Modules accepted: Orders

## 2019-05-31 NOTE — Addendum Note (Signed)
Addended by: Ann Held on: 05/31/2019 08:02 PM   Modules accepted: Orders

## 2019-07-20 ENCOUNTER — Encounter: Payer: Self-pay | Admitting: Physician Assistant

## 2019-07-20 ENCOUNTER — Other Ambulatory Visit: Payer: Self-pay

## 2019-07-20 ENCOUNTER — Ambulatory Visit (LOCAL_COMMUNITY_HEALTH_CENTER): Payer: Self-pay | Admitting: Physician Assistant

## 2019-07-20 VITALS — BP 108/74 | Ht <= 58 in | Wt 174.6 lb

## 2019-07-20 DIAGNOSIS — Z309 Encounter for contraceptive management, unspecified: Secondary | ICD-10-CM

## 2019-07-20 NOTE — Progress Notes (Signed)
Family Planning Visit- Repeat Yearly Visit  Subjective:  Danielle Ball is a 29 y.o. being seen today for an well woman visit and to discuss family planning options.    She is currently using condoms always for pregnancy prevention. Patient reports she does not want a pregnancy in the next year. Patient  has Incomplete miscarriage on their problem list. SAB 02/2019 with quant BHcg levels monitored down to 5mg /dL.  Chief Complaint  Patient presents with  . Gynecologic Exam    Patient reports she feels well but is concerned about prominent superficial veins on her breasts when she works out or . States h/o spider or varicose veins in legs. Occ bilat breast pain that waxes and wanes with her menstrual cycles. Last Pap 02/27/2015 WNL.  Patient denies smoking, h/o clotting or bleeding disorder.   Does the patient desire a pregnancy in the next year? nO (13/12/2014)  See flowsheet for other program required questions.   Body mass index is 37.67 kg/m. - Patient is eligible for diabetes screening based on BMI and age >108?  not applicable HA1C ordered? not applicable  Patient reports 1 of partners in last year. Desires STI screening?  No - declines - low risk, no symptoms  Does the patient have a current or past history of drug use? No   No components found for: HCV]   Health Maintenance Due  Topic Date Due  . TETANUS/TDAP  Never done  . PAP SMEAR-Modifier  02/26/2018    ROS  The following portions of the patient's history were reviewed and updated as appropriate: allergies, current medications, past family history, past medical history, past social history, past surgical history and problem list. Problem list updated.  Objective:   Vitals:   07/20/19 1025  BP: 108/74  Weight: 174 lb 9.6 oz (79.2 kg)  Height: 4' 9.09" (1.45 m)    Physical Exam   Well-appearing woman in NAD. Appears overweight. Head normocephalic. Normal work of breathing. Breasts  symmetrical with everted nipples. A few faint blue superficial vessels are noted on breasts, no tenderness, dominant mass or axillary LAD. Abdomen soft, nontender. External genitalia without lesion, rash or inguinal LAD. Vagina with scant white vag discharge, no odor or erythema. Cervix nontender, no discharge or ectropion/lesion. No adnexal tenderness, mass or uterine enlargement.  Assessment and Plan:  Danielle Ball is a 29 y.o. female presenting to the [facility] for an initial well woman exam/family planning visit  Contraception counseling: Reviewed all forms of birth control options in the tiered based approach. available including abstinence; over the counter/barrier methods; hormonal contraceptive medication including pill, patch, ring, injection,contraceptive implant; hormonal and nonhormonal IUDs; permanent sterilization options including vasectomy and the various tubal sterilization modalities. Risks, benefits, and typical effectiveness rates were reviewed.  Questions were answered.  Written information was also given to the patient to review.  Patient desires condoms only, this was given to patient. She will follow up in  12 mo for surveillance.  She was told to call with any further questions, or with any concerns about this method of contraception.  Emphasized use of condoms 100% of the time for STI prevention.  Patient was not offered ECP.   1. Encounter for contraceptive management, unspecified type Continue condoms; pt declines more statistically reliable BCM. Continue MVI with folic acid qd. Cervical cancer co-testing today. Breast exam normal. - IGP, Aptima HPV  Return in about 1 year (around 07/19/2020) for annual well-woman exam.  No future appointments.  Lora Havens, PA-C

## 2019-07-20 NOTE — Progress Notes (Signed)
Patient here states she needs a Pap, states last Pap was 4 years ago, also states she is having some concerns about prominent veins on her breasts. States her MGM died of stomach cancer. Last Pap in Epic, negative 02/27/2015.Marland KitchenBurt Knack, RN

## 2019-07-24 LAB — IGP, APTIMA HPV
HPV Aptima: POSITIVE — AB
PAP Smear Comment: 0

## 2019-07-26 ENCOUNTER — Telehealth: Payer: Self-pay

## 2019-07-26 NOTE — Telephone Encounter (Signed)
TC with patient.  Discussed abn pap (HPV+) with patient and need for colpo referral.  RN unsure whether patient fully understood conversation.  Explained BCCCP referral and why needed.  Patient somewhat verbalized understanding. Referral sent via Epic.  Richmond Campbell, RN   Interpreter- M. [place]

## 2019-08-01 NOTE — Progress Notes (Signed)
Patient had abnormal negative/HPV positive pap at [facility]. Patient pre-screened for BCCCP eligibility due to COVID 19 precautions. Two patient identifiers used for verification that I was speaking to correct patient.  Patient to Present directly to [facility] on 08/04/19 at 0800 for consult with Dr. Logan Bores.

## 2019-08-02 ENCOUNTER — Ambulatory Visit: Payer: Self-pay | Attending: Oncology

## 2019-08-02 DIAGNOSIS — R87810 Cervical high risk human papillomavirus (HPV) DNA test positive: Secondary | ICD-10-CM

## 2019-08-04 ENCOUNTER — Other Ambulatory Visit: Payer: Self-pay

## 2019-08-04 ENCOUNTER — Encounter: Payer: Self-pay | Admitting: Obstetrics and Gynecology

## 2019-08-10 ENCOUNTER — Other Ambulatory Visit: Payer: Self-pay | Admitting: Obstetrics and Gynecology

## 2019-08-10 ENCOUNTER — Encounter: Payer: Self-pay | Admitting: Obstetrics and Gynecology

## 2019-08-10 ENCOUNTER — Ambulatory Visit (INDEPENDENT_AMBULATORY_CARE_PROVIDER_SITE_OTHER): Payer: Self-pay | Admitting: Obstetrics and Gynecology

## 2019-08-10 ENCOUNTER — Other Ambulatory Visit: Payer: Self-pay

## 2019-08-10 VITALS — BP 125/85 | HR 94 | Ht 60.5 in | Wt 176.3 lb

## 2019-08-10 DIAGNOSIS — R87618 Other abnormal cytological findings on specimens from cervix uteri: Secondary | ICD-10-CM

## 2019-08-10 DIAGNOSIS — B977 Papillomavirus as the cause of diseases classified elsewhere: Secondary | ICD-10-CM

## 2019-08-10 DIAGNOSIS — N72 Inflammatory disease of cervix uteri: Secondary | ICD-10-CM

## 2019-08-10 NOTE — Progress Notes (Signed)
Referring Provider:  B[facility]CP  HPI:  Danielle Ball is a 29 y.o.  G2P1001  who presents today for evaluation and management of abnormal cervical cytology.    Dysplasia History:  2016 -normal cytology positive HPV           2021 -normal cytology positive HPV  ROS:  Pertinent items are noted in HPI.  OB History  Gravida Para Term Preterm AB Living  2 1 1  0 0 1  SAB TAB Ectopic Multiple Live Births  0 0 0 0 1    # Outcome Date GA Lbr Len/2nd Weight Sex Delivery Anes PTL Lv  2 Gravida           1 Term             Past Medical History:  Diagnosis Date  . Seizures (HCC)    Last age 89    Past Surgical History:  Procedure Laterality Date  . EYE SURGERY Right 1994  . HERNIA REPAIR  1998  . NASAL SEPTUM SURGERY      SOCIAL HISTORY: Social History   Substance and Sexual Activity  Alcohol Use Not Currently   Social History   Substance and Sexual Activity  Drug Use Never     Family History  Problem Relation Age of Onset  . Stomach cancer Maternal Grandmother   . Diabetes Maternal Grandfather     ALLERGIES:  Metoclopramide  She has a current medication list which includes the following prescription(s): ondansetron and misoprostol.  Physical Exam: -Vitals:  BP 125/85   Pulse 94   Ht 5' 0.5" (1.537 m)   Wt 176 lb 4.8 oz (80 kg)   LMP 07/12/2019 (Exact Date)   BMI 33.86 kg/m   PROCEDURE: Colposcopy performed with 4% acetic acid after verbal consent obtained                           -Aceto-white Lesions Location(s): See above              -Biopsy performed at 4 o'clock               -ECC indicated and performed: No.     -Biopsy sites made hemostatic with pressure and Monsel's solution   -Satisfactory colposcopy: Yes.      -Evidence of Invasive cervical CA :  NO  ASSESSMENT:  Danielle Ball is a 29 y.o. G2P1001 here for No diagnosis found.26  PLAN: 1.  I discussed the grading system of pap smears and HPV high risk viral types.  We will  discuss management after colpo results return. Because the patient did not have a good understanding of cytology versus HPV and its effect on Pap smears we had a fairly lengthy discussion describing the difference between cytology and HPV. She has a somewhat unique situation and that her cytology is completely normal despite having HPV for several years. All questions were answered.  No orders of the defined types were placed in this encounter.          F/U  Return in about 2 weeks (around 08/24/2019) for Colpo f/u. I spent 16 minutes involved in the care of this patient preparing to see the patient by obtaining and reviewing her medical history (including labs, imaging tests and prior procedures), documenting clinical information in the electronic health record (EHR), counseling and coordinating care plans, writing and sending prescriptions, ordering tests or procedures and directly communicating with the patient  by discussing pertinent items from her history and physical exam as well as detailing my assessment and plan as noted above so that she has an informed understanding.  All of her questions were answered.  Jeannie Fend ,MD 08/10/2019,9:16 AM

## 2019-08-14 LAB — ANATOMIC PATHOLOGY REPORT

## 2019-08-17 ENCOUNTER — Encounter: Payer: Self-pay | Admitting: Family Medicine

## 2019-08-17 DIAGNOSIS — R87619 Unspecified abnormal cytological findings in specimens from cervix uteri: Secondary | ICD-10-CM | POA: Insufficient documentation

## 2019-08-17 NOTE — Progress Notes (Signed)
Cervical biopsy benign.  Will follow per Dr. Logan Bores recommendation when patient sees him on 08/24/19. Copy to [facility].

## 2019-08-24 ENCOUNTER — Encounter: Payer: Self-pay | Admitting: Obstetrics and Gynecology

## 2019-08-24 ENCOUNTER — Ambulatory Visit (INDEPENDENT_AMBULATORY_CARE_PROVIDER_SITE_OTHER): Payer: Self-pay | Admitting: Obstetrics and Gynecology

## 2019-08-24 ENCOUNTER — Other Ambulatory Visit: Payer: Self-pay

## 2019-08-24 VITALS — BP 119/82 | HR 93 | Ht 61.0 in | Wt 172.1 lb

## 2019-08-24 DIAGNOSIS — B977 Papillomavirus as the cause of diseases classified elsewhere: Secondary | ICD-10-CM

## 2019-08-24 DIAGNOSIS — N72 Inflammatory disease of cervix uteri: Secondary | ICD-10-CM

## 2019-08-24 DIAGNOSIS — R300 Dysuria: Secondary | ICD-10-CM

## 2019-08-24 NOTE — Progress Notes (Signed)
HPI:      Ms. Christalyn L Francetta Ilg is a 29 y.o. G2P1001 who LMP was No LMP recorded.  Subjective:   She presents today for follow-up after her colposcopy.  She has a history of normal Pap smears but positive HPV [(cytology normal)  During our conversation the patient mentioned that she has pain and burning with urination and sometimes left sided back pain. She is having normal regular menstrual periods    Hx: The following portions of the patient's history were reviewed and updated as appropriate:             She  has a past medical history of Seizures ([place]). She does not have any pertinent problems on file. She  has a past surgical history that includes Nasal septum surgery; Eye surgery (Right, 1994); and Hernia repair (1998). Her family history includes Diabetes in her maternal grandfather; Stomach cancer in her maternal grandmother. She  reports that she has never smoked. She has never used smokeless tobacco. She reports previous alcohol use. She reports that she does not use drugs. She has a current medication list which includes the following prescription(s): ondansetron. She is allergic to metoclopramide.       Review of Systems:  Review of Systems  Constitutional: Denied constitutional symptoms, night sweats, recent illness, fatigue, fever, insomnia and weight loss.  Eyes: Denied eye symptoms, eye pain, photophobia, vision change and visual disturbance.  Ears/Nose/Throat/Neck: Denied ear, nose, throat or neck symptoms, hearing loss, nasal discharge, sinus congestion and sore throat.  Cardiovascular: Denied cardiovascular symptoms, arrhythmia, chest pain/pressure, edema, exercise intolerance, orthopnea and palpitations.  Respiratory: Denied pulmonary symptoms, asthma, pleuritic pain, productive sputum, cough, dyspnea and wheezing.  Gastrointestinal: Denied, gastro-esophageal reflux, melena, nausea and vomiting.  Genitourinary: See HPI for additional information.   Musculoskeletal: Denied musculoskeletal symptoms, stiffness, swelling, muscle weakness and myalgia.  Dermatologic: Denied dermatology symptoms, rash and scar.  Neurologic: Denied neurology symptoms, dizziness, headache, neck pain and syncope.  Psychiatric: Denied psychiatric symptoms, anxiety and depression.  Endocrine: Denied endocrine symptoms including hot flashes and night sweats.   Meds:   Current Outpatient Medications on File Prior to Visit  Medication Sig Dispense Refill  . ondansetron (ZOFRAN ODT) 4 MG disintegrating tablet Take 1 tablet (4 mg total) by mouth every 6 (six) hours as needed for nausea. 20 tablet 0   No current facility-administered medications on file prior to visit.    Objective:     Vitals:   08/24/19 1121  BP: 119/82  Pulse: 93              Normal cytology by colposcopically directed biopsies  Assessment:    G2P1001 Patient Active Problem List   Diagnosis Date Noted  . Abnormal Pap smear of cervix 08/17/2019  . Incomplete miscarriage 03/20/2019     1. High risk human papilloma virus (HPV) infection of cervix   2. Dysuria     Positive HPV-negative cytology by colpo.  Possible UTI based on symptomatology of dysuria and occasional flank pain.   Plan:            1.  We have discussed the natural course and history of HPV and its relationship to cellular changes.  All of her questions were answered.  I have recommended a follow-up Pap smear with HPV in 1 year.  2.  Urine for C&S-we will contact her with any abnormal results. Orders No orders of the defined types were placed in this encounter.   No orders of  the defined types were placed in this encounter.     F/U  No follow-ups on file. I spent 23 minutes involved in the care of this patient preparing to see the patient by obtaining and reviewing her medical history (including labs, imaging tests and prior procedures), documenting clinical information in the electronic health record  (EHR), counseling and coordinating care plans, writing and sending prescriptions, ordering tests or procedures and directly communicating with the patient by discussing pertinent items from her history and physical exam as well as detailing my assessment and plan as noted above so that she has an informed understanding.  All of her questions were answered.  Elonda Husky, M.D. 08/24/2019 11:46 AM

## 2019-08-26 LAB — URINE CULTURE: Organism ID, Bacteria: NO GROWTH

## 2019-12-15 NOTE — Progress Notes (Unsigned)
Per 08/24/2019 Colpo f/u with Dr. Logan Bores, patient to repeat PAP in 1 year 07/2020.  Coralee Rud, RN at [facility] sent chart message on 12/14/2019 to Dr. Alvester Morin inquiring whether patient to have PAP at [facility] or [facility].  Per Dr. Alvester Morin:  Patient can have PAP at [facility].  Patient has been added to the PAP tickler for April 2022. Hart Carwin, RN

## 2020-08-23 ENCOUNTER — Telehealth: Payer: Self-pay | Admitting: Nurse Practitioner

## 2020-08-23 NOTE — Telephone Encounter (Signed)
Telephone call to patient regarding PAP follow up.  Patient states she is currently out of the [place] and will not return until June 2022.  Will move patient to June PAP tickler system and attempt to schedule another follow up appointment. Glenna Fellows, RN

## 2020-10-16 ENCOUNTER — Encounter: Payer: Self-pay | Admitting: Nurse Practitioner

## 2020-10-16 NOTE — Progress Notes (Signed)
Telephone call to patient regarding PAP follow up.  Patient was out of the country at the time of initial phone call.  No answer today, left message to call. Glenna Fellows, RN

## 2020-10-24 ENCOUNTER — Ambulatory Visit: Payer: Self-pay

## 2020-11-25 IMAGING — US US OB < 14 WEEKS - US OB TV
1 series · 13 of 28 positions shown · non-contrast
Comparison: None.

CLINICAL DATA: Vaginal bleeding

EXAM:
OBSTETRIC <14 WK US AND TRANSVAGINAL OB US
TECHNIQUE: Both transabdominal and transvaginal ultrasound examinations were
performed for complete evaluation of the gestation as well as the
maternal uterus, adnexal regions, and pelvic cul-de-sac.
Transvaginal technique was performed to assess early pregnancy.

[Series 1: us ob < 14 weeks - us ob tv · 13 of 35 slices shown]
[im 2/35]
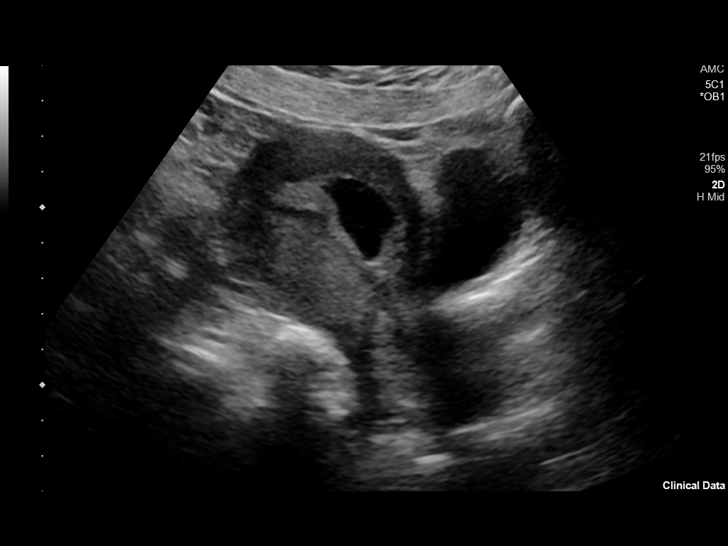
[im 4/35]
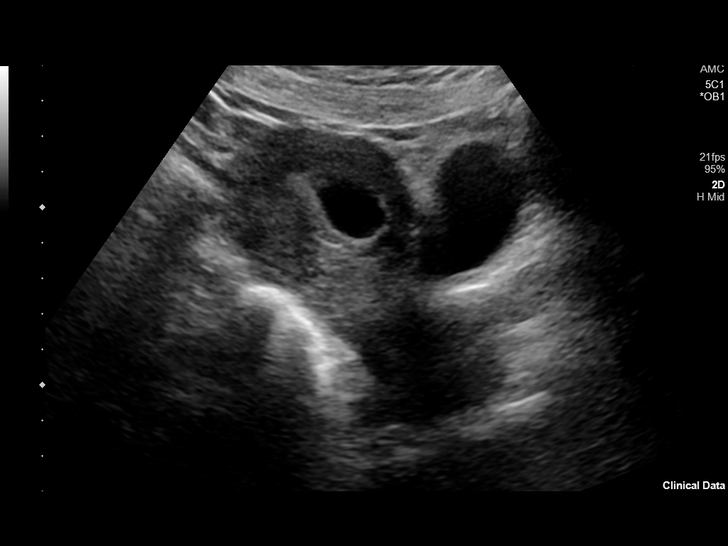
[im 7/35]
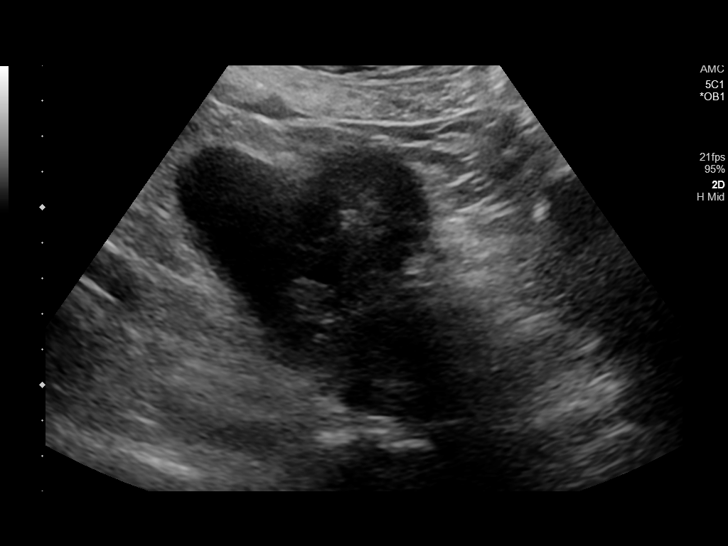
[im 9/35]
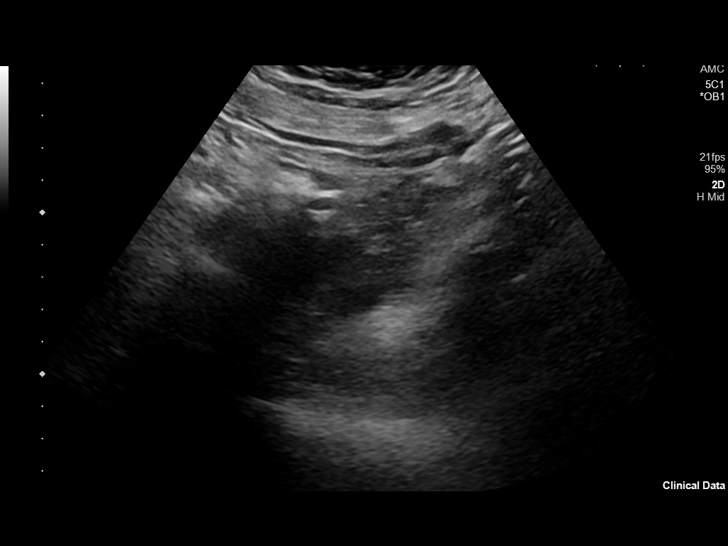
[im 12/35]
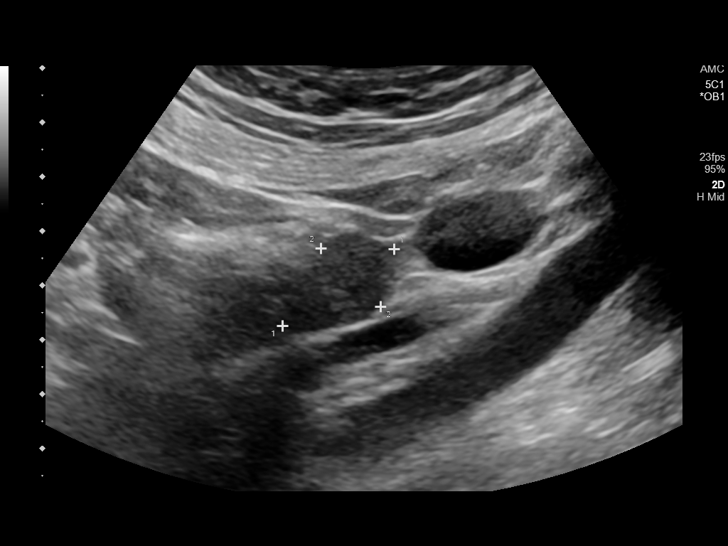
[im 14/35]
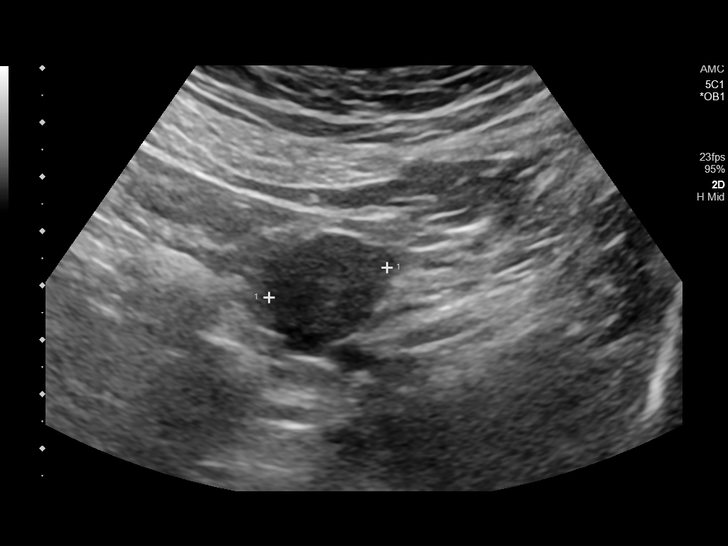
[im 18/35]
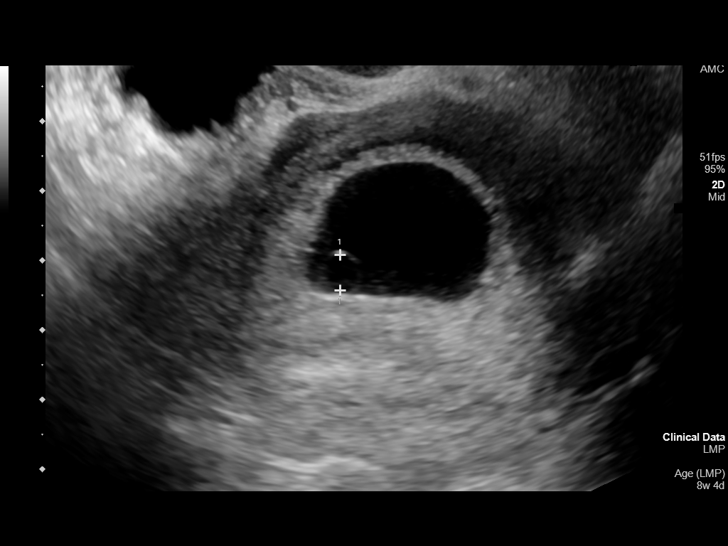
[im 21/35]
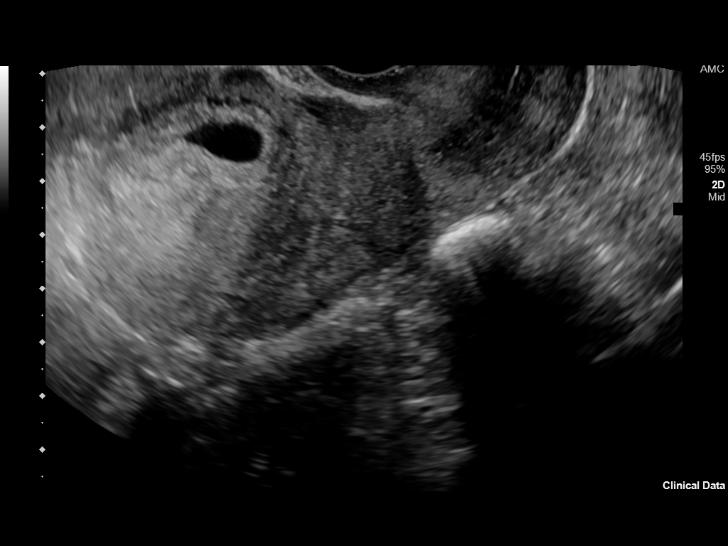
[im 23/35]
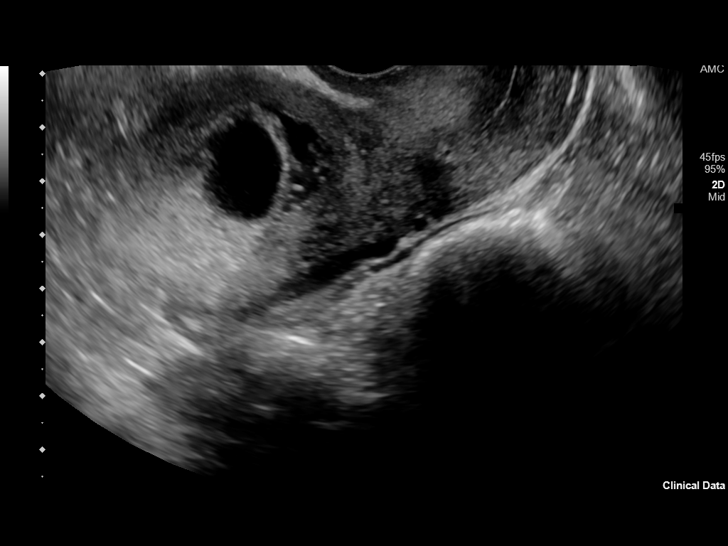
[im 26/35]
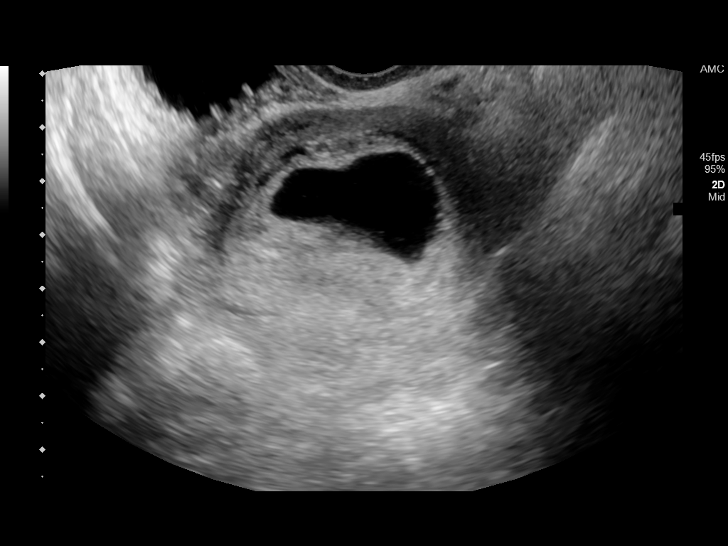
[im 28/35]
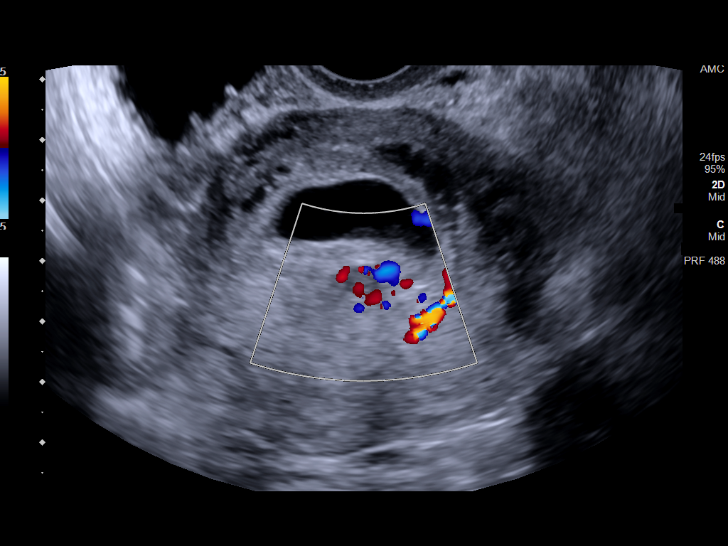
[im 31/35]
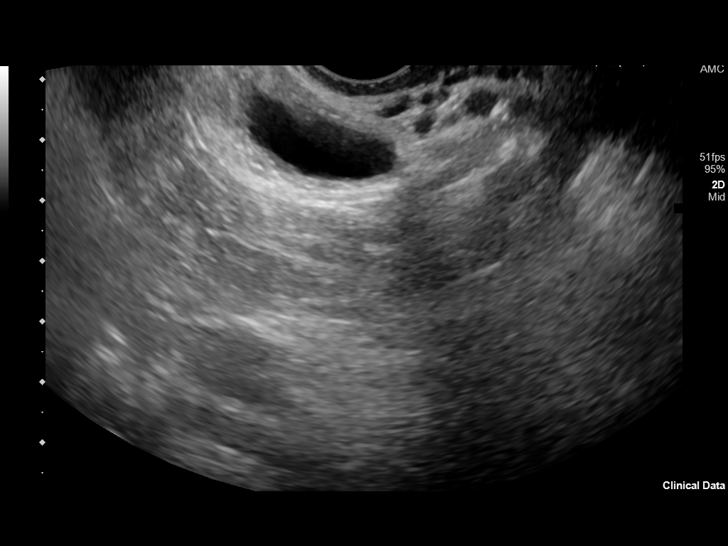
[im 33/35]
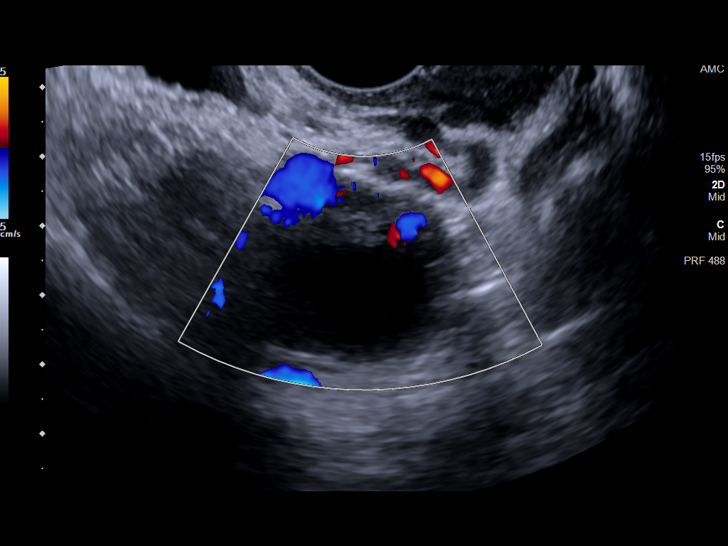

[13 of 28 positions shown; findings below may reference images not displayed]

FINDINGS: Intrauterine gestational sac: Single.  Somewhat irregularly shaped.

Yolk sac:  Visualized.

Embryo:  Not Visualized.

Cardiac Activity: Not Visualized.

MSD: 25.2 mm   7 w   4 d

US EDC: 09/30/2019

Subchorionic hemorrhage: Small subchorionic hemorrhage along the
anteroinferior margin of the gestational sac measuring 1.7 x 0.7 cm.

Maternal uterus/adnexae: Small hypoechoic intramural fibroid along
the posterior uterine body measuring up to 0.8 cm. Probable corpus
luteal cyst noted within the right ovary. Left ovary appears
unremarkable. Probable left paraovarian cyst with possible retracted
internal hemorrhagic component. No free fluid within the cul-de-sac.
IMPRESSION: 1. Probable early intrauterine gestational sac with yolk sac, but no
fetal pole or cardiac activity yet visualized. Gestational sac is
slightly irregularly shaped with a small adjacent subchorionic
hemorrhage. Recommend follow-up quantitative B-HCG levels and
follow-up US in 14 days to assess viability. This recommendation
follows SRU consensus guidelines: Diagnostic Criteria for Nonviable
Pregnancy Early in the First Trimester. N Engl J Med 4754;
2. Likely hemorrhagic left paraovarian cyst.
3. Probable right corpus luteal cyst.

## 2020-12-11 IMAGING — US US OB < 14 WEEKS - US OB TV
1 series · 13 of 28 positions shown · non-contrast
Comparison: 02/15/2019

CLINICAL DATA: Vaginal bleeding during early pregnancy

EXAM:
OBSTETRIC <14 WK US AND TRANSVAGINAL OB US
TECHNIQUE: Both transabdominal and transvaginal ultrasound examinations were
performed for complete evaluation of the gestation as well as the
maternal uterus, adnexal regions, and pelvic cul-de-sac.
Transvaginal technique was performed to assess early pregnancy.

[Series 1: us ob < 14 weeks - us ob tv · 13 of 96 slices shown]
[im 4/96]
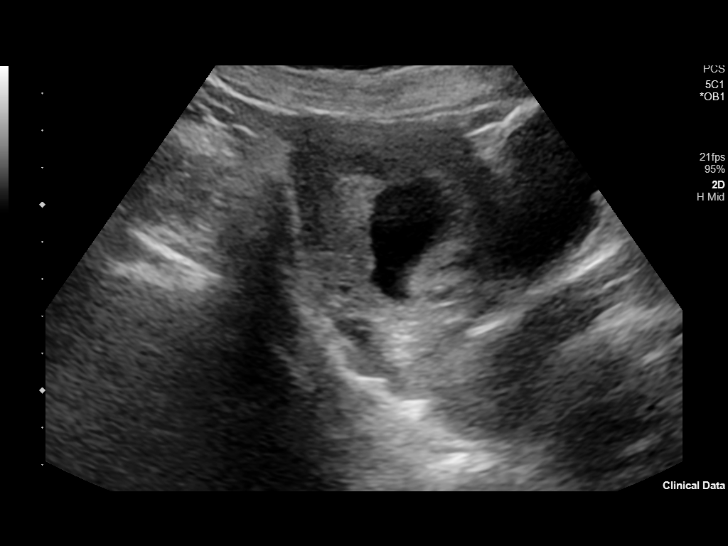
[im 11/96]
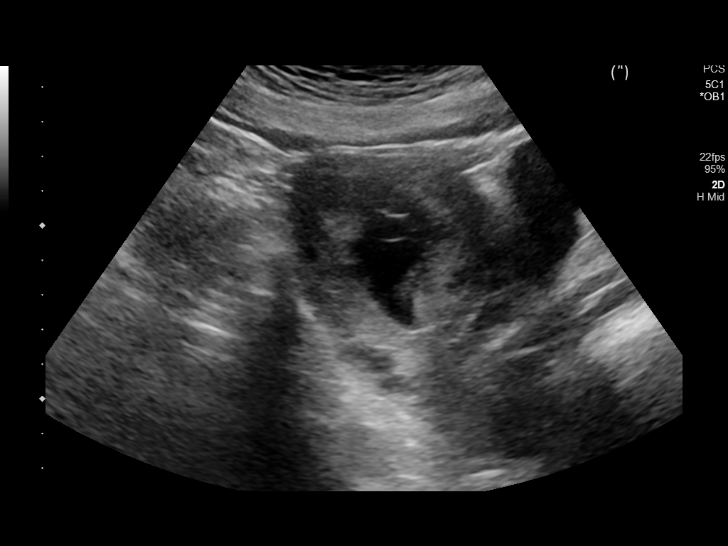
[im 18/96]
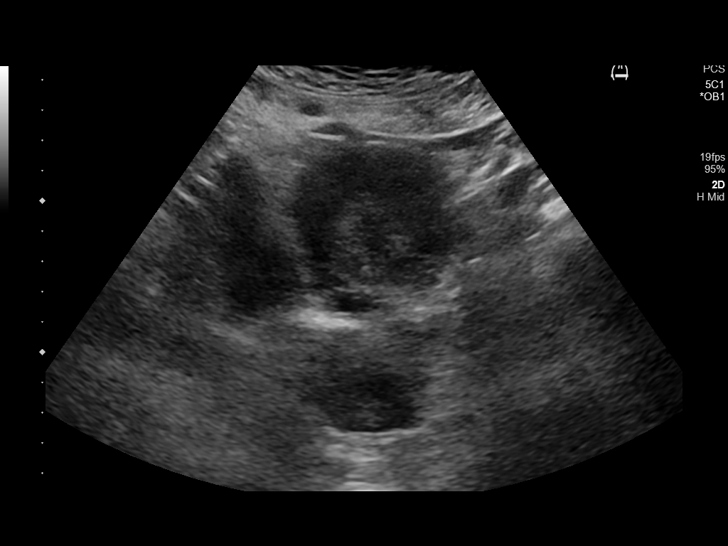
[im 25/96]
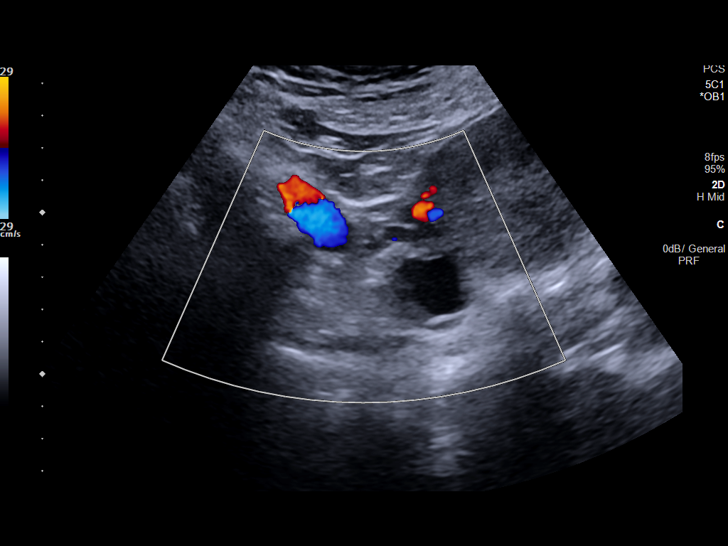
[im 32/96]
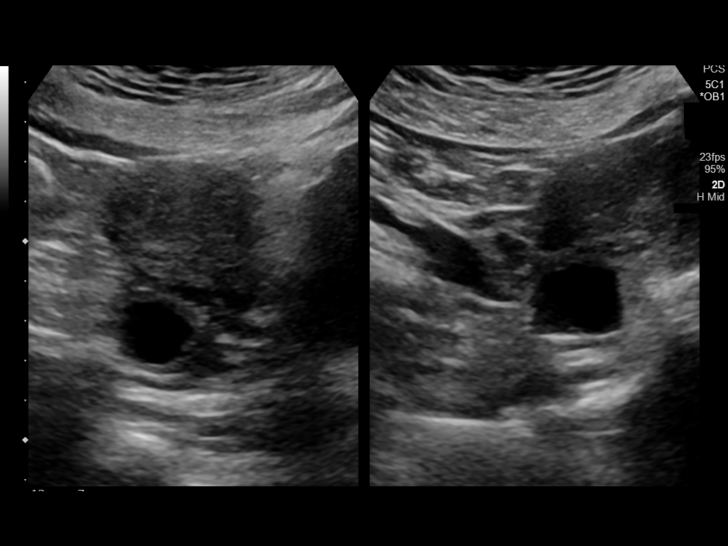
[im 39/96]
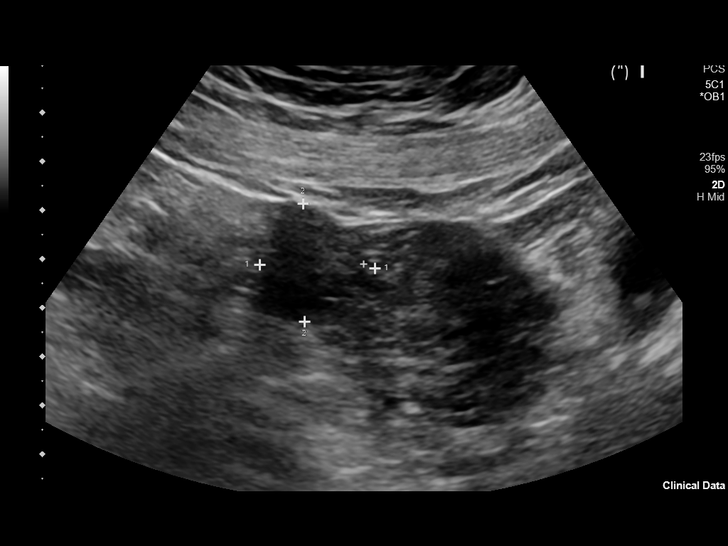
[im 50/96]
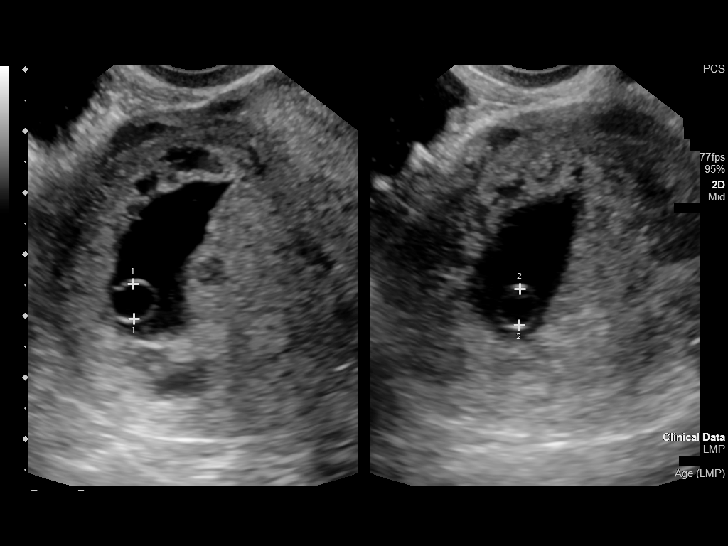
[im 57/96]
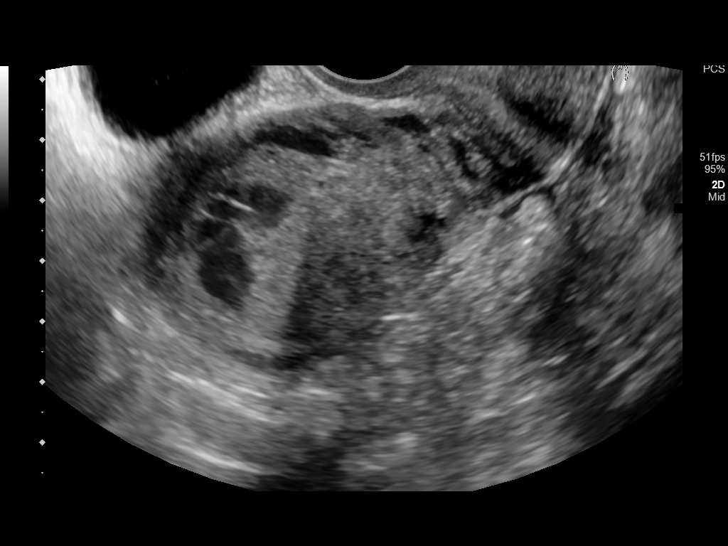
[im 64/96]
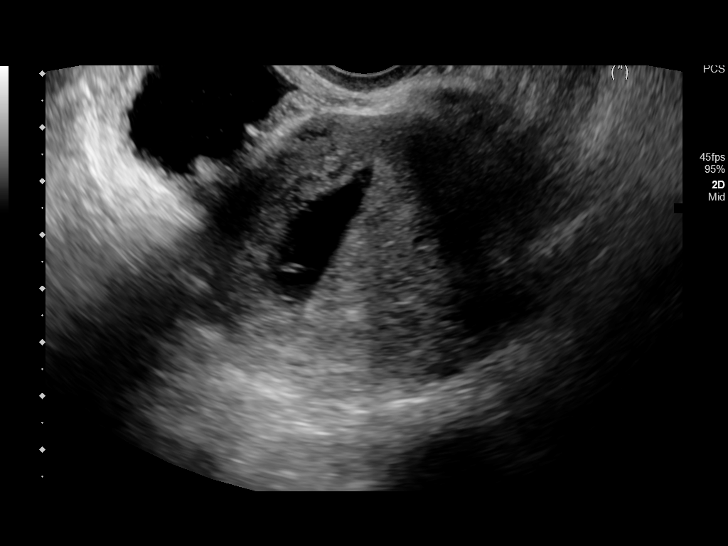
[im 71/96]
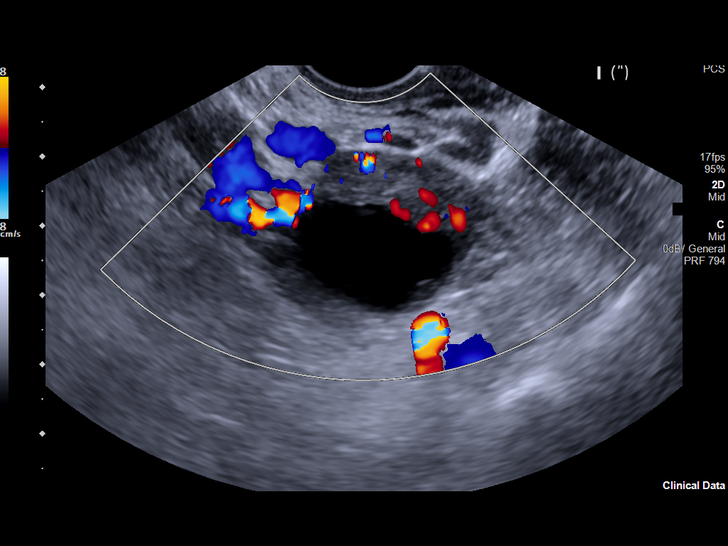
[im 78/96]
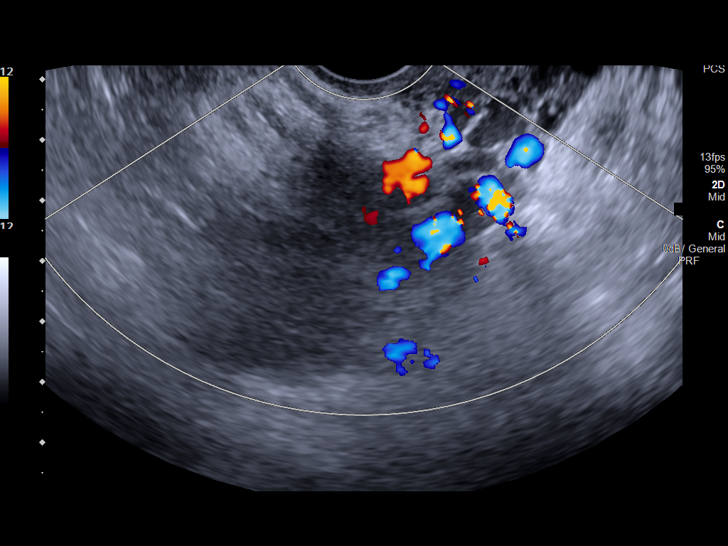
[im 85/96]
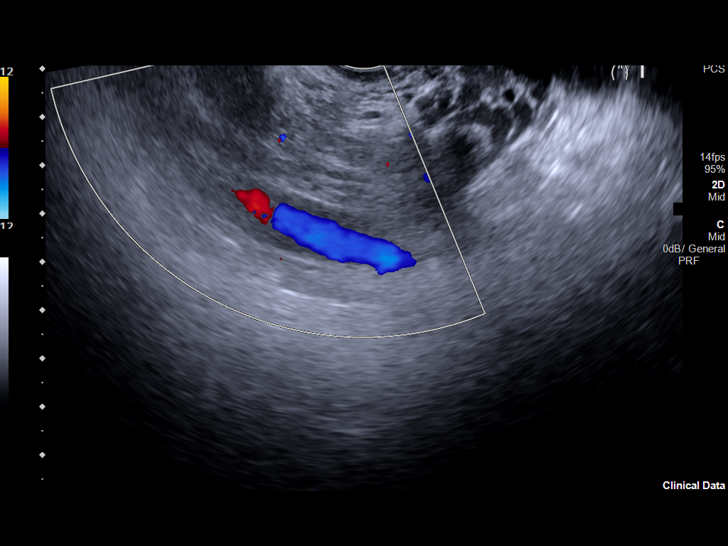
[im 92/96]
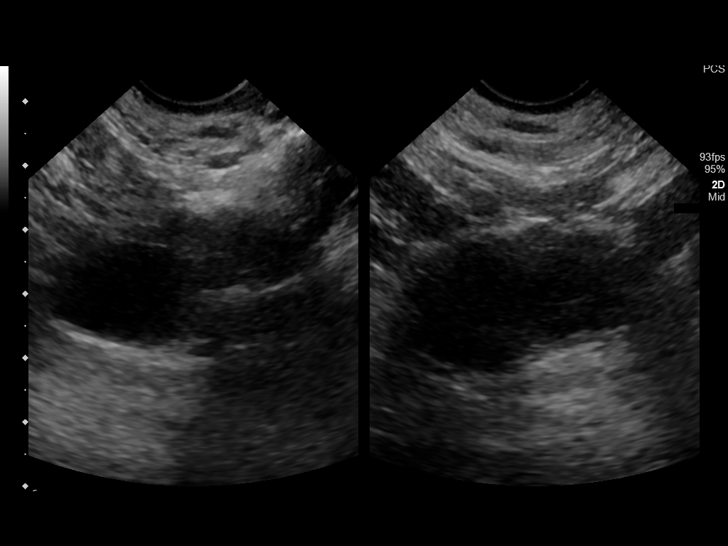

[13 of 28 positions shown; findings below may reference images not displayed]

FINDINGS: Intrauterine gestational sac: Present, single, elongated

Yolk sac:  Present

Embryo:  Not identified

Cardiac Activity: N/A

Heart Rate: N/A  bpm

MSD: 24.1 mm   7 w   3 d

Subchorionic hemorrhage:  None visualized.

Maternal uterus/adnexae:

RIGHT ovary measures 3.3 x 2.6 x 3.2 cm and contains a small corpus
luteal cyst.

LEFT ovary normal size and morphology 3.4 x 2.1 x 3.9 cm.

No free pelvic fluid or adnexal masses.
IMPRESSION: Elongated single gestational sac within the uterus containing a yolk
sac but no fetal pole is identified.

The prior exam of 02/15/2019 also demonstrated a gestational sac
with a yolk sac but no fetal pole.

Findings meet definitive criteria for failed pregnancy. This follows
SRU consensus guidelines: Diagnostic Criteria for Nonviable
Pregnancy Early in the First Trimester. N Engl J Med

## 2020-12-12 ENCOUNTER — Emergency Department
Admission: EM | Admit: 2020-12-12 | Discharge: 2020-12-12 | Disposition: A | Discharge status: Home / Self Care | Payer: Self-pay | Attending: Student in an Organized Health Care Education/Training Program | Admitting: Student in an Organized Health Care Education/Training Program

## 2020-12-12 ENCOUNTER — Other Ambulatory Visit: Payer: Self-pay

## 2020-12-12 DIAGNOSIS — R519 Headache, unspecified: Secondary | ICD-10-CM | POA: Insufficient documentation

## 2020-12-12 DIAGNOSIS — K0889 Other specified disorders of teeth and supporting structures: Secondary | ICD-10-CM | POA: Insufficient documentation

## 2020-12-12 MED ORDER — IBUPROFEN 600 MG PO TABS
600.0000 mg | ORAL_TABLET | Freq: Once | ORAL | Status: AC
  Administered 2020-12-12: 600 mg via ORAL
  Filled 2020-12-12: qty 1

## 2020-12-12 MED ORDER — AMOXICILLIN 500 MG PO CAPS
500.0000 mg | ORAL_CAPSULE | Freq: Once | ORAL | Status: AC
  Administered 2020-12-12: 500 mg via ORAL
  Filled 2020-12-12: qty 1

## 2020-12-12 MED ORDER — AMOXICILLIN 500 MG PO CAPS: 500.0000 mg | ORAL_CAPSULE | Freq: Three times a day (TID) | ORAL | 0 refills | Status: AC

## 2020-12-12 NOTE — ED Provider Notes (Signed)
[facility] Emergency Department Provider Note    Event Date/Time   First MD Initiated Contact with Patient 12/12/20 1044     (approximate)  I have reviewed the triage vital signs and the nursing notes.   HISTORY  Chief Complaint Dental Pain    HPI Danielle Ball is a 30 y.o. female no significant past medical history other than dental caries and root canal present for ration of left facial pain and dental pain for the past several days.  No fevers.  No visual disturbance.  Denies any trauma.  Has not been on recent antibiotics.  No trouble swallowing.  No tongue swelling.  No new medications.   Past Medical History:  Diagnosis Date   Seizures (HCC)    Last age 23   Family History  Problem Relation Age of Onset   Stomach cancer Maternal Grandmother    Diabetes Maternal Grandfather    Past Surgical History:  Procedure Laterality Date   EYE SURGERY Right 1994   HERNIA REPAIR  1998   NASAL SEPTUM SURGERY     Patient Active Problem List   Diagnosis Date Noted   Abnormal Pap smear of cervix 08/17/2019   Incomplete miscarriage 03/20/2019      Prior to Admission medications   Medication Sig Start Date End Date Taking? Authorizing Provider  amoxicillin (AMOXIL) 500 MG capsule Take 1 capsule (500 mg total) by mouth 3 (three) times daily for 7 days. 12/12/20 12/19/20 Yes Willy Eddy, MD  ondansetron (ZOFRAN ODT) 4 MG disintegrating tablet Take 1 tablet (4 mg total) by mouth every 6 (six) hours as needed for nausea. 03/03/19   Federico Flake, MD    Allergies Metoclopramide    Social History Social History   Tobacco Use   Smoking status: Never   Smokeless tobacco: Never  Vaping Use   Vaping Use: Never used  Substance Use Topics   Alcohol use: Not Currently   Drug use: Never    Review of Systems Patient denies headaches, rhinorrhea, blurry vision, numbness, shortness of breath, chest pain, edema, cough, abdominal  pain, nausea, vomiting, diarrhea, dysuria, fevers, rashes or hallucinations unless otherwise stated above in HPI. ____________________________________________   PHYSICAL EXAM:  VITAL SIGNS: Vitals:   12/12/20 1002  BP: 116/72  Pulse: 64  Resp: 16  Temp: 97.6 F (36.4 C)  SpO2: 100%    Constitutional: Alert and oriented. Well appearing and in no acute distress. Eyes: Conjunctivae are normal. EOMI Head: Atraumatic. Nose: No congestion/rhinnorhea. Mouth/Throat: Mucous membranes are moist.  No sign of periapical abscess slight fullness to the left facial area no periorbital swelling no proptosis.   Neck: Painless ROM.  Cardiovascular:   Good peripheral circulation. Respiratory: Normal respiratory effort.  No retractions.  Gastrointestinal: Soft and nontender.  Musculoskeletal: No lower extremity tenderness .  No joint effusions. Neurologic:  Normal speech and language. No gross focal neurologic deficits are appreciated.  Skin:  Skin is warm, dry and intact. No rash noted. Psychiatric: Mood and affect are normal. Speech and behavior are normal.  ____________________________________________   LABS (all labs ordered are listed, but only abnormal results are displayed)  No results found for this or any previous visit (from the past 24 hour(s)). ____________________________________________ ____________________________________________  RADIOLOGY   ____________________________________________   PROCEDURES  Procedure(s) performed:  Procedures    Critical Care performed: no ____________________________________________   INITIAL IMPRESSION / ASSESSMENT AND PLAN / ED COURSE  Pertinent labs & imaging results that were available  during my care of the patient were reviewed by me and considered in my medical decision making (see chart for details).   DDX: Dental caries, abscess, facial cellulitis  Danielle Ball is a 30 y.o. who presents to the ED with an as  described above.  Patient with likely dental carry with some mild left facial cellulitis no abscess.  No proptosis no sign of ocular involvement.  No meningismus.  No sign of Ludwick's.  Oropharynx otherwise unremarkable.  Will place on antibiotic and give referral for dental follow-up.    The patient was evaluated in Emergency Department today for the symptoms described in the history of present illness. He/she was evaluated in the context of the global COVID-19 pandemic, which necessitated consideration that the patient might be at risk for infection with the SARS-CoV-2 virus that causes COVID-19. Institutional protocols and algorithms that pertain to the evaluation of patients at risk for COVID-19 are in a state of rapid change based on information released by regulatory bodies including the CDC and federal and state organizations. These policies and algorithms were followed during the patient's care in the ED.   ____________________________________________   FINAL CLINICAL IMPRESSION(S) / ED DIAGNOSES  Final diagnoses:  Pain, dental  Acute nonintractable headache, unspecified headache type      NEW MEDICATIONS STARTED DURING THIS VISIT:  New Prescriptions   AMOXICILLIN (AMOXIL) 500 MG CAPSULE    Take 1 capsule (500 mg total) by mouth 3 (three) times daily for 7 days.     Note:  This document was prepared using Dragon voice recognition software and may include unintentional dictation errors.     Willy Eddy, MD 12/12/20 1058

## 2020-12-12 NOTE — ED Notes (Signed)
Video spanish interpreter used for med admin and discharge instruction, name: Violet ID #  [id number]

## 2020-12-12 NOTE — ED Triage Notes (Addendum)
Triage completed using interpreter [phone]  Pt to ER via POV with complaints of left sided jaw and dental pain. Reports waking up this morning with the left side of her face swollen. Hx of root canal on affected tooth. Pain started on Monday.

## 2020-12-12 NOTE — Discharge Instructions (Addendum)
OPTIONS FOR DENTAL FOLLOW UP CARE ° °[place] Department of Health and Human Services - Local Safety Net Dental Clinics °http://www.ncdhhs.gov/dph/oralhealth/services/safetynetclinics.htm °  °[facility] (336-562-3123) ° °[facility] [place] ([phone]) ° °[facility] (919-663-1744 ext [phone]) ° °[facility] ([phone]) ° °[facility] ([phone]) °This clinic caters to the indigent population and is on a lottery system. °Location: °[facility], [address], [place] °Clinic Hours: °Wednesdays from 6pm - 9pm, patients seen by a lottery system. °For dates, call or go to www.med.[facility].edu/[facility]/patients/Dental-[facility] °Services: °Cleanings, fillings and simple extractions. °Payment Options: °DENTAL WORK IS FREE OF CHARGE. Bring proof of income or support. °Best way to get seen: °Arrive at 5:15 pm - this is a lottery, NOT first come/first serve, so arriving earlier will not increase your chances of being seen. °  °  °[facility] °[phone] °Select option 1 for emergencies °  °Location: °[facility], [address], [place] °Clinic Hours: °No walk-ins accepted - call the day before to schedule an appointment. °Check in times are 9:30 am and 1:30 pm. °Services: °Simple extractions, temporary fillings, pulpectomy/pulp debridement, uncomplicated abscess drainage. °Payment Options: °PAYMENT IS DUE AT THE TIME OF SERVICE.  Fee is usually $100-200, additional surgical procedures (e.g. abscess drainage) may be extra. °Cash, checks, Visa/MasterCard accepted.  Can file Medicaid if patient is covered for dental - patient should call case worker to check. °No discount for [facility] patients. °Best way to get seen: °MUST call the day before and get onto the schedule. Can usually be seen the next 1-2 days. No walk-ins accepted. °  °  °[facility] °[phone] °   °Location: °[facility], [address], [place] °Clinic Hours: °M, W, Th, F 8am or 1:30pm, Tues 9a or 1:30 - first come/first served. °Services: °Simple extractions, temporary fillings, uncomplicated abscess drainage.  You do not need to be an [place] resident. °Payment Options: °PAYMENT IS DUE AT THE TIME OF SERVICE. °Dental insurance, otherwise sliding scale - bring proof of income or support. °Depending on income and treatment needed, cost is usually $50-200. °Best way to get seen: °Arrive early as it is first come/first served. °  °  °[facility] °[phone] °  °Location: °[address] °Clinic Hours: °Mon-Thu 8a-5p °Services: °Most basic dental services including extractions and fillings. °Payment Options: °PAYMENT IS DUE AT THE TIME OF SERVICE. °Sliding scale, up to 50% off - bring proof if income or support. °Medicaid with dental option accepted. °Best way to get seen: °Call to schedule an appointment, can usually be seen within 2 weeks OR they will try to see walk-ins - show up at 8a or 2p (you may have to wait). °  °  °[facility] °[phone] °[place] RESIDENTS ONLY °  °Location: °[facility], [address], [place], [place] [address] °Clinic Hours: By appointment only. °Monday - Thursday 8am-5pm, Friday 8am-12pm °Services: Cleanings, fillings, extractions. °Payment Options: °PAYMENT IS DUE AT THE TIME OF SERVICE. °Cash, Visa or MasterCard. Sliding scale - $30 minimum per service. °Best way to get seen: °Come in to office, complete packet and make an appointment - need proof of income °or support monies for each household member and proof of [place] residence. °Usually takes about a month to get in. °  °  °[facility] °[phone] °  °Location: °[address]   [place] °Clinic Hours: Walk-in Urgent Care Dental Services are offered Monday-Friday  mornings only. °The numbers of emergencies accepted daily is limited to the number of °providers available. °Maximum 15 - Mondays, Wednesdays & Thursdays °Maximum 10 - Tuesdays & Fridays °Services: °You do not need to be a [place] resident to be seen for a dental emergency. °Emergencies are defined as pain, swelling, abnormal bleeding, or dental trauma. Walkins will receive x-rays if needed. °NOTE: Dental cleaning is not an emergency. °Payment Options: °PAYMENT IS DUE AT THE TIME OF SERVICE. °Minimum co-pay is $40.00 for uninsured patients. °Minimum co-pay is $3.00 for Medicaid with dental coverage. °Dental Insurance is accepted and must be presented at time of visit. °Medicare does not cover dental. °Forms of payment: Cash, credit card, checks. °Best way to get seen: °If not previously registered with the clinic, walk-in dental registration begins at 7:15 am and is on a first come/first serve basis. °If previously registered with the clinic, call to make an appointment. °  °  °[facility] °[phone] °[place] RESIDENTS ONLY °  °Location: °[address], [place], [place] °Clinic Hours: °Mon-Thu 10a-2p °Services: Extractions only! °Payment Options: °FREE (donations accepted) - bring proof of income or support °Best way to get seen: °Call and schedule an appointment OR come at 8am on the 1st Monday of every month (except for holidays) when it is first come/first served. °  °  °[facility] °[phone] °  °Location: °[address], [place] °Clinic Hours: °Friday mornings °Services, Payment Options, Best way to get seen: °Call for info °

## 2020-12-25 ENCOUNTER — Ambulatory Visit: Payer: Self-pay

## 2021-01-20 ENCOUNTER — Ambulatory Visit (LOCAL_COMMUNITY_HEALTH_CENTER): Payer: Self-pay | Admitting: Advanced Practice Midwife

## 2021-01-20 ENCOUNTER — Other Ambulatory Visit: Payer: Self-pay

## 2021-01-20 VITALS — BP 100/65 | Ht 61.0 in | Wt 173.2 lb

## 2021-01-20 DIAGNOSIS — Z3009 Encounter for other general counseling and advice on contraception: Secondary | ICD-10-CM

## 2021-01-20 DIAGNOSIS — E669 Obesity, unspecified: Secondary | ICD-10-CM | POA: Insufficient documentation

## 2021-01-20 DIAGNOSIS — R87618 Other abnormal cytological findings on specimens from cervix uteri: Secondary | ICD-10-CM

## 2021-01-20 LAB — WET PREP FOR TRICH, YEAST, CLUE: Trichomonas Exam: NEGATIVE

## 2021-01-20 NOTE — Progress Notes (Addendum)
Dental list and PCP lists given. Wet prep reviewed, patient treated for yeast per SO.Marland KitchenBurt Knack, RN

## 2021-01-20 NOTE — Progress Notes (Signed)
[facility] [address] Main Number: [phone]    Family Planning Visit- Initial Visit  Subjective:  Danielle Ball is a 30 y.o. MHF nonsmoker [id number](5 yo daughter)   being seen today for an initial annual visit and to discuss contraceptive options.  The patient is currently using None for pregnancy prevention. Patient reports she does want a pregnancy in the next year.  Patient has the following medical conditions has Incomplete miscarriage; Abnormal Pap smear of cervix; and Obesity BMI=32.7 on their problem list.  Chief Complaint  Patient presents with   Annual Exam    Repeat Pap    Patient reports here for physical and pap. LMP 01/03/21. Last sex 01/06/21 without condom; with current partner x 6 years; 1 partner in last 3 mo. Last pap 07/20/19 neg HPV +. Colpo 08/10/19 with f/u on 09/07/19 and needs repeat pap 07/2020. SAB 02/2019. Living with husband and daughter; not working. Not using birth control because wants to conceive.   Patient denies cigs, vaping, cigars  Body mass index is 32.73 kg/m. - Patient is eligible for diabetes screening based on BMI and age >83?  not applicable HA1C ordered? not applicable  Patient reports 1  partner/s in last year. Desires STI screening?  Yes  Has patient been screened once for HCV in the past?  No  No results found for: HCVAB  Does the patient have current drug use (including MJ), have a partner with drug use, and/or has been incarcerated since last result? No  If yes-- Screen for HCV through [facility]   Does the patient meet criteria for HBV testing? No  Criteria:  -Household, sexual or needle sharing contact with HBV -History of drug use -HIV positive -Those with known Hep C   Health Maintenance Due  Topic Date Due   COVID-19 Vaccine (1) Never done   Hepatitis C Screening  Never done   TETANUS/TDAP  Never done   PAP SMEAR-Modifier  07/19/2020   INFLUENZA  VACCINE  Never done    Review of Systems  Genitourinary:  Positive for dysuria (c/o dysuria past couple days; referred to primary care MD).  All other systems reviewed and are negative.  The following portions of the patient's history were reviewed and updated as appropriate: allergies, current medications, past family history, past medical history, past social history, past surgical history and problem list. Problem list updated.   See flowsheet for other program required questions.  Objective:   Vitals:   01/20/21 1037  BP: 100/65  Weight: 173 lb 3.2 oz (78.6 kg)  Height: 5\' 1"  (1.549 m)    Physical Exam Constitutional:      Appearance: Normal appearance. She is obese.  HENT:     Head: Normocephalic and atraumatic.     Mouth/Throat:     Mouth: Mucous membranes are moist.     Comments: Pain in front tooth; went to ER and received antibiotics; last dental exam in with cap in same tooth that is causing pain; urged dental exam asap Eyes:     Conjunctiva/sclera: Conjunctivae normal.  Cardiovascular:     Rate and Rhythm: Normal rate and regular rhythm.  Pulmonary:     Effort: Pulmonary effort is normal.     Breath sounds: Normal breath sounds.  Chest:  Breasts:    Right: Normal.     Left: Normal.  Abdominal:     Palpations: Abdomen is soft.     Comments:  Poor tone, soft without masses or tenderness, increased adipose  Genitourinary:    General: Normal vulva.     Exam position: Lithotomy position.     Vagina: Vaginal discharge (white creamy leukorrhea, ph<4.5) present.     Cervix: Normal.     Uterus: Normal.      Adnexa: Right adnexa normal and left adnexa normal.     Rectum: Normal.     Comments: Pap done Musculoskeletal:        General: Normal range of motion.     Cervical back: Normal range of motion and neck supple.  Skin:    General: Skin is warm and dry.  Neurological:     Mental Status: She is alert.  Psychiatric:        Mood and Affect: Mood  normal.      Assessment and Plan:  Danielle Ball is a 30 y.o. female presenting to the [facility] Department for an initial annual wellness/contraceptive visit  Contraception counseling: Reviewed all forms of birth control options in the tiered based approach. available including abstinence; over the counter/barrier methods; hormonal contraceptive medication including pill, patch, ring, injection,contraceptive implant, ECP; hormonal and nonhormonal IUDs; permanent sterilization options including vasectomy and the various tubal sterilization modalities. Risks, benefits, and typical effectiveness rates were reviewed.  Questions were answered.  Written information was also given to the patient to review.  Patient desires nothing, this was prescribed for patient. She will follow up in  prn for surveillance.  She was told to call with any further questions, or with any concerns about this method of contraception.  Emphasized use of condoms 100% of the time for STI prevention.  Patient was offered ECP. ECP was not accepted by the patient. ECP counseling was not given - see RN documentation  1. Family planning Wants to conceive and declines birth control Treat wet mount per standing orders Immunization nurse consult Please give primary care MD list to pt Please give dental list to pt - HIV [facility] - Syphilis Serology, [facility] - Chlamydia/Gonorrhea  Lab - WET PREP FOR TRICH, YEAST, CLUE - IGP, Aptima HPV  2. Obesity, unspecified classification, unspecified obesity type, unspecified whether serious comorbidity present   3. Other abnormal cytological finding of specimen from cervix Pap done today     No follow-ups on file.  No future appointments.  Alberteen Spindle, CNM

## 2021-01-20 NOTE — Progress Notes (Signed)
Patient here for PE and follow-up pap test. Last PE 07/20/2019. Last Pap 07/20/2019, NIL and HPV positive. Colpo 08/10/2019 and colpo follow-up 08/24/2019.Marland KitchenBurt Knack, RN

## 2021-01-24 LAB — IGP, APTIMA HPV
HPV Aptima: NEGATIVE
PAP Smear Comment: 0

## 2022-04-20 ENCOUNTER — Encounter: Payer: Self-pay | Admitting: Emergency Medicine

## 2022-04-20 ENCOUNTER — Emergency Department: Payer: Self-pay

## 2022-04-20 ENCOUNTER — Other Ambulatory Visit: Payer: Self-pay

## 2022-04-20 ENCOUNTER — Emergency Department
Admission: EM | Admit: 2022-04-20 | Discharge: 2022-04-20 | Disposition: A | Discharge status: Home / Self Care | Payer: Self-pay | Attending: Emergency Medicine | Admitting: Emergency Medicine

## 2022-04-20 DIAGNOSIS — R112 Nausea with vomiting, unspecified: Secondary | ICD-10-CM | POA: Insufficient documentation

## 2022-04-20 DIAGNOSIS — R1011 Right upper quadrant pain: Secondary | ICD-10-CM

## 2022-04-20 DIAGNOSIS — K8020 Calculus of gallbladder without cholecystitis without obstruction: Secondary | ICD-10-CM | POA: Insufficient documentation

## 2022-04-20 LAB — URINALYSIS, ROUTINE W REFLEX MICROSCOPIC
Bacteria, UA: NONE SEEN
Bilirubin Urine: NEGATIVE
Glucose, UA: NEGATIVE mg/dL
Ketones, ur: NEGATIVE mg/dL
Leukocytes,Ua: NEGATIVE
Nitrite: NEGATIVE
Protein, ur: 30 mg/dL — AB
Specific Gravity, Urine: 1.027 (ref 1.005–1.030)
pH: 5 (ref 5.0–8.0)

## 2022-04-20 LAB — COMPREHENSIVE METABOLIC PANEL
ALT: 20 U/L (ref 0–44)
AST: 25 U/L (ref 15–41)
Albumin: 3.6 g/dL (ref 3.5–5.0)
Alkaline Phosphatase: 68 U/L (ref 38–126)
Anion gap: 5 (ref 5–15)
BUN: 13 mg/dL (ref 6–20)
CO2: 22 mmol/L (ref 22–32)
Calcium: 8.4 mg/dL — ABNORMAL LOW (ref 8.9–10.3)
Chloride: 107 mmol/L (ref 98–111)
Creatinine, Ser: 0.7 mg/dL (ref 0.44–1.00)
GFR, Estimated: >60 mL/min (ref >60)
Glucose, Bld: 109 mg/dL — ABNORMAL HIGH (ref 70–99)
Potassium: 3.9 mmol/L (ref 3.5–5.1)
Sodium: 134 mmol/L — ABNORMAL LOW (ref 135–145)
Total Bilirubin: 0.6 mg/dL (ref 0.3–1.2)
Total Protein: 6.9 g/dL (ref 6.5–8.1)

## 2022-04-20 LAB — CBC
HCT: 39.8 % (ref 36.0–46.0)
Hemoglobin: 13.1 g/dL (ref 12.0–15.0)
MCH: 29.2 pg (ref 26.0–34.0)
MCHC: 32.9 g/dL (ref 30.0–36.0)
MCV: 88.6 fL (ref 80.0–100.0)
Platelets: 221 10*3/uL (ref 150–400)
RBC: 4.49 MIL/uL (ref 3.87–5.11)
RDW: 14 % (ref 11.5–15.5)
WBC: 7.9 10*3/uL (ref 4.0–10.5)
nRBC: 0 % (ref 0.0–0.2)

## 2022-04-20 LAB — LIPASE, BLOOD: Lipase: 40 U/L (ref 11–51)

## 2022-04-20 LAB — POC URINE PREG, ED: Preg Test, Ur: NEGATIVE

## 2022-04-20 MED ORDER — ONDANSETRON 4 MG PO TBDP
4.0000 mg | ORAL_TABLET | Freq: Once | ORAL | Status: AC
  Administered 2022-04-20: 4 mg via ORAL
  Filled 2022-04-20: qty 1

## 2022-04-20 MED ORDER — OXYCODONE-ACETAMINOPHEN 5-325 MG PO TABS: 1.0000 | ORAL_TABLET | ORAL | 0 refills | Status: AC | PRN

## 2022-04-20 MED ORDER — OXYCODONE-ACETAMINOPHEN 5-325 MG PO TABS
1.0000 | ORAL_TABLET | Freq: Once | ORAL | Status: AC
  Administered 2022-04-20: 1 via ORAL
  Filled 2022-04-20: qty 1

## 2022-04-20 MED ORDER — ONDANSETRON 4 MG PO TBDP: 4.0000 mg | ORAL_TABLET | Freq: Three times a day (TID) | ORAL | 0 refills | Status: AC | PRN

## 2022-04-20 NOTE — ED Provider Notes (Signed)
[facility] Provider Note    Event Date/Time   First MD Initiated Contact with Patient 04/20/22 1209     (approximate)   History   Back Pain and Abdominal Pain  Formal translating service utilized HPI  Meliss L Roshawna Colclasure is a 32 y.o. female no significant past medical history presents to the emergency department with abdominal pain.  Woke up this morning and had severe right upper quadrant abdominal pain.  Associated with an episode of nausea and vomiting.  States that it lasted for about an hour and after she had vomiting she no longer felt nauseous but continues to have ongoing abdominal pain.  Nothing improves or worsens the pain.  States that she ate a pork dish last night.  No fever or chills.  No dysuria, urinary urgency or frequency.     Physical Exam   Triage Vital Signs: ED Triage Vitals  Enc Vitals Group     BP 04/20/22 1119 114/80     Pulse Rate 04/20/22 1119 96     Resp 04/20/22 1119 16     Temp 04/20/22 1119 97.8 F (36.6 C)     Temp Source 04/20/22 1119 Oral     SpO2 04/20/22 1119 95 %     Weight 04/20/22 1117 171 lb 15.3 oz (78 kg)     Height 04/20/22 1117 5\' 1"  (1.549 m)     Head Circumference --      Peak Flow --      Pain Score 04/20/22 1117 10     Pain Loc --      Pain Edu? --      Excl. in [place]? --     Most recent vital signs: Vitals:   04/20/22 1119 04/20/22 1317  BP: 114/80 118/78  Pulse: 96 90  Resp: 16 16  Temp: 97.8 F (36.6 C)   SpO2: 95% 96%    Physical Exam Constitutional:      Appearance: She is well-developed.  HENT:     Head: Atraumatic.  Eyes:     Conjunctiva/sclera: Conjunctivae normal.  Cardiovascular:     Rate and Rhythm: Regular rhythm.  Pulmonary:     Effort: No respiratory distress.  Abdominal:     General: There is no distension.     Tenderness: There is abdominal tenderness in the right upper quadrant.  Musculoskeletal:        General: Normal range of motion.     Cervical back:  Normal range of motion.  Skin:    General: Skin is warm.  Neurological:     Mental Status: She is alert. Mental status is at baseline.      IMPRESSION / MDM / ASSESSMENT AND PLAN / ED COURSE  I reviewed the triage vital signs and the nursing notes.  Differential diagnosis including symptomatic cholelithiasis, cholecystitis, gastritis/PUD, cyclic vomiting syndrome, pancreatitis, appendicitis, bowel obstruction, viral illness   RADIOLOGY I independently reviewed imaging, my interpretation of imaging: Ultrasound right upper quadrant -cholelithiasis with sludge no signs of acute cholecystitis.   Labs (all labs ordered are listed, but only abnormal results are displayed) Labs interpreted as -  No leukocytosis.  Normal T. bili.  No significant signs of an urinary tract infection.  Creatinine at baseline with no significant electrolyte abnormalities  Labs Reviewed  COMPREHENSIVE METABOLIC PANEL - Abnormal; Notable for the following components:      Result Value   Sodium 134 (*)    Glucose, Bld 109 (*)    Calcium  8.4 (*)    All other components within normal limits  URINALYSIS, ROUTINE W REFLEX MICROSCOPIC - Abnormal; Notable for the following components:   Color, Urine YELLOW (*)    APPearance CLOUDY (*)    Hgb urine dipstick MODERATE (*)    Protein, ur 30 (*)    All other components within normal limits  LIPASE, BLOOD  CBC  POC URINE PREG, ED      Treated with Percocet and ODT Zofran  Discussed the patient's case with general surgery Dr. Hampton Abbot who recommended outpatient follow-up.  His office will call her to schedule an appointment on Friday to discuss elective cholecystectomy.  Discussed with the patient gallbladder diet and symptomatic treatment with pain control and return to the emergency department for any return of symptoms.  Patient tolerating p.o. and had improvement of her pain at time of discharge.   PROCEDURES:  Critical Care performed:  No  Procedures  Patient's presentation is most consistent with acute presentation with potential threat to life or bodily function.   MEDICATIONS ORDERED IN ED: Medications  oxyCODONE-acetaminophen (PERCOCET/ROXICET) 5-325 MG per tablet 1 tablet (1 tablet Oral Given 04/20/22 1242)  ondansetron (ZOFRAN-ODT) disintegrating tablet 4 mg (4 mg Oral Given 04/20/22 1242)  oxyCODONE-acetaminophen (PERCOCET/ROXICET) 5-325 MG per tablet 1 tablet (1 tablet Oral Given 04/20/22 1418)    FINAL CLINICAL IMPRESSION(S) / ED DIAGNOSES   Final diagnoses:  RUQ pain  Nausea and vomiting, unspecified vomiting type  Calculus of gallbladder without cholecystitis without obstruction     Rx / DC Orders   ED Discharge Orders          Ordered    oxyCODONE-acetaminophen (PERCOCET) 5-325 MG tablet  Every 4 hours PRN        04/20/22 1410    ondansetron (ZOFRAN-ODT) 4 MG disintegrating tablet  Every 8 hours PRN        04/20/22 1410             Note:  This document was prepared using Dragon voice recognition software and may include unintentional dictation errors.   Nathaniel Man, MD 04/20/22 1425

## 2022-04-20 NOTE — ED Triage Notes (Signed)
Pt reports pain to mid back and RUQ of abd for an hour. Pt reports some NV as well.

## 2022-04-21 ENCOUNTER — Telehealth: Payer: Self-pay

## 2022-04-21 NOTE — Telephone Encounter (Signed)
Per Dr.Piscoya - add patient to [facility]le at 11;45 cholelithiasis.

## 2022-04-24 ENCOUNTER — Ambulatory Visit (INDEPENDENT_AMBULATORY_CARE_PROVIDER_SITE_OTHER): Payer: Self-pay | Admitting: Surgery

## 2022-04-24 ENCOUNTER — Encounter: Payer: Self-pay | Admitting: Surgery

## 2022-04-24 VITALS — BP 116/81 | HR 106 | Temp 97.6°F | Wt 180.6 lb

## 2022-04-24 DIAGNOSIS — K8020 Calculus of gallbladder without cholecystitis without obstruction: Secondary | ICD-10-CM

## 2022-04-24 NOTE — Progress Notes (Signed)
04/24/2022  Reason for Visit:  Symptomatic cholelithiasis  History of Present Illness: Danielle Ball Danielle Ball is a 32 y.o. female presenting for evaluation of symptomatic cholelithiasis.  The patient presented to the emergency room on 04/20/2022 with a 1 day history of worsening right upper quadrant pain that was radiating to the back, associated with nausea and emesis.  In the emergency room, her workup normal labs with a white blood cell count of 7.9 as well as normal LFTs.  She had ultrasound of the right upper quadrant which showed cholelithiasis and sludge but otherwise no pericholecystic fluid or wall thickening.  Her symptoms are improved in the emergency room and she was able to be discharged home.  The patient reports that overall she has been having intermittent episodes of right upper quadrant pain for the past 2 years perhaps.  She reports that this episode however was the most significant and intense which prompted her to go to the emergency room.  Since her discharge from the emergency room, she reported she has been having very mild discomfort in the right upper quadrant but denies any nausea or vomiting.  She has been able to maintain a strict low-fat diet.  Past Medical History: Past Medical History:  Diagnosis Date   Seizures ([place])    Last age 16   Vaginal Pap smear, abnormal      Past Surgical History: Past Surgical History:  Procedure Laterality Date   EYE SURGERY Right 1994   HERNIA REPAIR Left 1998   Left inguinal hernia   NASAL SEPTUM SURGERY      Home Medications: Prior to Admission medications   Medication Sig Start Date End Date Taking? Authorizing Provider  ondansetron (ZOFRAN-ODT) 4 MG disintegrating tablet Take 1 tablet (4 mg total) by mouth every 8 (eight) hours as needed for nausea or vomiting. 04/20/22   Nathaniel Man, MD  oxyCODONE-acetaminophen (PERCOCET) 5-325 MG tablet Take 1 tablet by mouth every 4 (four) hours as needed for up to 5 days for severe pain.  04/20/22 04/25/22  Nathaniel Man, MD    Allergies: Allergies  Allergen Reactions   Metoclopramide Palpitations    Rapid heart rate - used in [place] as Metocloprina    Social History:  reports that she quit smoking about 10 years ago. Her smoking use included cigarettes. She has never used smokeless tobacco. She reports that she does not currently use alcohol. She reports that she does not use drugs.   Family History: Family History  Problem Relation Age of Onset   Stomach cancer Maternal Grandmother    Diabetes Maternal Grandfather    Diabetes Maternal Aunt    Diabetes Maternal Aunt     Review of Systems: Review of Systems  Constitutional:  Negative for chills and fever.  HENT:  Negative for hearing loss.   Respiratory:  Positive for cough. Negative for shortness of breath.   Cardiovascular:  Negative for chest pain.  Gastrointestinal:  Positive for abdominal pain, nausea and vomiting. Negative for constipation and diarrhea.  Genitourinary:  Negative for dysuria.  Musculoskeletal:  Positive for back pain. Negative for myalgias.  Skin:  Negative for rash.  Neurological:  Negative for dizziness.  Psychiatric/Behavioral:  Negative for depression.     Physical Exam BP 116/81   Pulse (!) 106   Temp 97.6 F (36.4 C) (Oral)   Wt 180 lb 9.6 oz (81.9 kg)   LMP 04/13/2022 (Approximate)   SpO2 99%   BMI 34.12 kg/m  CONSTITUTIONAL: No acute distress HEENT:  Normocephalic, atraumatic, extraocular motion intact. NECK: Trachea is midline, and there is no jugular venous distension.  RESPIRATORY: Patient is coughing a dry cough but on exam, lungs are clear, and breath sounds are equal bilaterally. Normal respiratory effort without pathologic use of accessory muscles. CARDIOVASCULAR: Low-grade tachycardia but sinus rhythm. GI: The abdomen is soft, nondistended, currently nontender to palpation.  Negative Murphy's sign.  MUSCULOSKELETAL:  Normal muscle strength and tone in all four  extremities.  No peripheral edema or cyanosis. SKIN: Skin turgor is normal. There are no pathologic skin lesions.  NEUROLOGIC:  Motor and sensation is grossly normal.  Cranial nerves are grossly intact. PSYCH:  Alert and oriented to person, place and time. Affect is normal.  Laboratory Analysis: Labs from 04/20/2022: Sodium 134, potassium 3.9, chloride 107, CO2 22, BUN 13, creatinine 0.7.  Total bilirubin 0.6, AST 25, ALT 20, alkaline phosphatase 68, lipase 40, albumin 2.6.  WBC 7.9, hemoglobin 13.1, hematocrit 39.8, platelets 221.  Imaging: Ultrasound RUQ on 04/20/2022: IMPRESSION: 1. Small amount of biliary sludge and gallstone noted in the gallbladder. However, no findings to suggest an acute cholecystitis are noted on today's examination.  Assessment and Plan: This is a 32 y.o. female with symptomatic cholelithiasis.  - Discussed with the patient the findings in the emergency room with her ultrasound and laboratory workup.  Overall her ultrasound showed cholelithiasis with no evidence of cholecystitis and her labs were unremarkable.  She was able to get discharged home with strict low-fat diet and some pain medications.  The patient reports that this week she has been doing well with some intermittent low-grade discomfort in the right upper quadrant but without any further nausea or vomiting.  She has maintain a low-fat diet as instructed. - Discussed with the patient the possibilities for conservative measures with a low-fat diet versus surgical management with a cholecystectomy.  Discussed with her that unfortunately there is no great medical way to get rid of the stones or sludge and although a low-fat diet may help trigger the gallbladder less, unfortunately she still at risk of potential episodes.  She reports that she has been dealing with this for 2 years now and would rather proceed with surgery to prevent further episodes. - As such, I discussed with the patient the role for a robotic  assisted cholecystectomy.  I reviewed the surgery at length with her including the incisions, the risks of bleeding, infection, injury to surrounding structures, that this will be an outpatient procedure, the use of ICG to better evaluate the biliary anatomy intraoperatively, postoperative activity restrictions, pain control, and she is willing to proceed. - Currently the patient is having a viral illness and she has been coughing and congested.  As such, I think it would be best to wait for a couple of weeks to allow for her symptoms to improve prior to proceeding with general anesthesia and endotracheal intubation.  She is in agreement with this.  We will schedule surgery for 05/07/2022.  Return precautions given.  All of her questions have been answered.  I spent 60 minutes dedicated to the care of this patient on the date of this encounter to include pre-visit review of records, face-to-face time with the patient discussing diagnosis and management, and any post-visit coordination of care.   Melvyn Neth, [facility]

## 2022-04-24 NOTE — Patient Instructions (Addendum)
Danielle Ball programadora de [place], [place], lo llamar dentro de 24 a 48 horas para programar su cita. Si no ha tenido noticias suyas despus de 48 horas, llame a nuestra oficina. Tenga la hoja azul disponible cuando llame para anotar informacin importante.     Colelitiasis Cholelithiasis  La colelitiasis ocurre cuando se forman clculos biliares en la vescula biliar. La vescula biliar almacena bilis. La bilis es un lquido que ayuda a [employer]. La bilis puede endurecerse y transformarse en clculos biliares. Si estos causan una obstruccin, Doctor, hospital (ataque de vescula biliar). Cules son las causas? Esta afeccin puede ser causada por lo siguiente: Algunas enfermedades de la sangre, como la anemia drepanoctica. Demasiada cantidad de una sustancia parecida a la grasa (colesterol) en la bilis. No tener suficiente cantidad de sales biliares en la bilis. Estas sales le ayudan al organismo a Tax adviser y a Chief Financial Officer. La vescula biliar no se vaca completamente o con suficiente frecuencia. Esto es frecuente en las mujeres embarazadas. Qu incrementa el riesgo? Los siguientes factores pueden hacer que sea ms propenso a Emergency planning/management officer afeccin: Ser mujer. Estar embarazada muchas veces. Comer muchos alimentos fritos, grasas y carbohidratos refinados. Tener mucho sobrepeso (obesidad). Ser mayor de 106 aos de edad. Usar medicamentos con hormonas femeninas durante [employer]. Adelgazar rpidamente. Tener clculos biliares en la familia. Tener algunos problemas de [place], como diabetes, enfermedad de [place] o enfermedad heptica. Cules son los signos o sntomas? A menudo, puede haber clculos biliares, pero sin sntomas. Estos clculos biliares se denominan clculos silenciosos. Si un clculo biliar provoca una obstruccin, puede sentir dolor repentino. El dolor: Puede estar en la parte superior derecha del vientre (abdomen). Normalmente aparece a la noche o  despus de comer. Puede durar Ardelia Mems hora o ms. Se puede extender hacia el hombro derecho, la espalda o el pecho. Puede sentirse Pharmacist, community, ardor o sensacin de plenitud en la parte superior del vientre (indigestin). Si la obstruccin dura ms de algunas horas, puede tener una infeccin o hinchazn. Usted puede: Sentir que va a vomitar. Vomitar. Sentirse hinchado. Tener dolor en el vientre durante 5 horas o ms. Sentir dolor con la palpacin en el vientre, a menudo en la parte superior derecha y debajo de las [place]. Tener fiebre o escalofros. Notar que la piel o la zona blanca de los ojos se vuelven amarillas (ictericia). Tener el pis (orina) oscuro o las deposiciones (heces) plidas. Cmo se trata? El tratamiento de esta afeccin depende de qu tan mal se sienta. Si tiene sntomas, puede necesitar lo siguiente: Multimedia programmer, si los sntomas no son Orlene Erm graves. No coma durante 12 a 24 horas. Beba solamente agua y lquidos claros. Comience a comer alimentos simples o claros despus de 1 o 2 das. Pruebe con caldos y galletas. Es posible que necesite medicamentos para el dolor o Social research officer, government, o ambos. Si tiene una infeccin, necesitar antibiticos. Hospitalizacin, si tiene un dolor muy intenso o una infeccin muy grave. Ciruga para extirpar la vescula biliar. Puede ser necesario si: Los clculos biliares siguen apareciendo. Tiene sntomas muy graves. Medicamentos para destruir los clculos biliares. Medicamentos: Son mejores para los clculos pequeos. Pueden usarse durante hasta 6 a 12 meses. Un procedimiento para encontrar y extraer los clculos biliares o para fragmentarlos. Siga estas instrucciones en su casa: Medicamentos Use los medicamentos de venta libre y los recetados solamente como se lo haya indicado el mdico. Si le recetaron un antibitico, tmelo como se lo haya indicado el  mdico. No deje de tomar el antibitico aunque comience a sentirse  mejor. Pregntele al mdico si el medicamento recetado le impide conducir o usar [place]. Comida y bebida Beba suficiente lquido como para Theatre manager la orina de color amarillo plido. Beba agua o lquidos claros. Esto es importante cuando Tree surgeon. Consuma alimentos saludables. Elija: Menos alimentos grasos, como las comidas fritas. Menos carbohidratos refinados. Evite los panes y los cereales muy procesados, como el pan blanco y el arroz blanco. Elija cereales integrales, como el pan integral y Dwyane Luo integral. Ms Fredderick [place]. Las [place], las frutas frescas y los frijoles son fuentes saludables. Instrucciones generales Mantenga un peso saludable. Concurra a todas las visitas de seguimiento como se lo haya indicado el mdico. Esto es importante. Dnde buscar ms informacin [employer] of Diabetes and Digestive and Kidney Diseases ([place] Diabetes y las Enfermedades Digestivas y Renales): [email or web address] Comunquese con un mdico si: Siente dolor repentino en el costado superior derecho del vientre. El dolor podra extenderse hasta el hombro derecho, la espalda o el pecho. Le han diagnosticado clculos biliares que no presentan sntomas y tiene lo siguiente: Dolor abdominal. Molestias, ardor o sensacin de plenitud en la parte superior del abdomen. La orina es de color oscuro o tiene heces plidas. Solicite ayuda de inmediato si: Tiene dolor repentino en la parte superior derecha del abdomen y el dolor dura ms de 2 horas. Tiene dolor en el abdomen y: Dura ms de 5 horas. Sigue empeorando. Tiene fiebre o escalofros. Tiene ganas continuas de vomitar. Sigue vomitando. La piel y la parte blanca de los ojos se ponen amarillos. Resumen La colelitiasis ocurre cuando se forman clculos biliares en la vescula biliar. La causa de esta afeccin puede ser Ardelia Mems enfermedad de la [place], una cantidad excesiva de una sustancia parecida a la grasa en la bilis o una  cantidad insuficiente de sales biliares. El tratamiento de esta afeccin depende de qu tan mal se sienta. Si tiene sntomas, no coma ni beba. Es posible que necesite tomar medicamentos. Tal vez necesite hospitalizacin si tiene un dolor muy intenso o una infeccin muy grave. Es posible que deba someterse a una ciruga si los clculos siguen apareciendo o si tiene sntomas muy graves. Esta informacin no tiene Marine scientist el consejo del mdico. Asegrese de hacerle al mdico cualquier pregunta que tenga. Document Revised: 05/12/2019 Document Reviewed: 05/12/2019 Elsevier Patient Education  [place].

## 2022-04-27 ENCOUNTER — Telehealth: Payer: Self-pay | Admitting: Surgery

## 2022-04-27 NOTE — Telephone Encounter (Signed)
Left message for patient to call, please inform her of the following scheduled surgery with Dr. Hampton Abbot.    Pre-Admission date/time, and Surgery date at [facility].  Surgery Date: 05/07/22 Preadmission Testing Date: 04/30/22 (phone 1p-5p)  Also patient will need to call at [phone], between 1-3:00pm the day before surgery, to find out what time to arrive for surgery.

## 2022-04-29 NOTE — Telephone Encounter (Signed)
Patient aware of all dates regarding surgery.

## 2022-04-30 ENCOUNTER — Encounter
Admission: RE | Admit: 2022-04-30 | Discharge: 2022-04-30 | Disposition: A | Discharge status: Home / Self Care | Payer: Self-pay | Source: Ambulatory Visit | Attending: Surgery | Admitting: Surgery

## 2022-04-30 VITALS — Ht 61.0 in | Wt 180.0 lb

## 2022-04-30 DIAGNOSIS — Z01812 Encounter for preprocedural laboratory examination: Secondary | ICD-10-CM

## 2022-04-30 HISTORY — DX: Obesity, unspecified: E66.9

## 2022-04-30 HISTORY — DX: Obesity, class 1: E66.811

## 2022-04-30 HISTORY — DX: Calculus of gallbladder without cholecystitis without obstruction: K80.20

## 2022-04-30 NOTE — Pre-Procedure Instructions (Signed)
During pre-admission testing interview; patient was coughing. Stated that she began having cold symptoms of coughing and congestion on 04/19/22 but is getting better. Denied taking a covid test. Instructed to call Dr. Mont Dutton office on Monday if symptoms do not improve by then. Surgery is scheduled for Thursday, 05/07/22. Acknowledged understanding that surgery would be cancelled if she is still sick.

## 2022-04-30 NOTE — Patient Instructions (Addendum)
Your procedure is scheduled on: Thursday, January 18 Report to the Registration Desk on the 1st floor of the [facility] To find out your arrival time, please call [phone] between 1PM - 3PM on: Wednesday, January 17 If your arrival time is 6:00 am, do not arrive prior to that time as the [facility] entrance doors do not open until 6:00 am.  REMEMBER: Instructions that are not followed completely may result in serious medical risk, up to and including death; or upon the discretion of your surgeon and anesthesiologist your surgery may need to be rescheduled.  Do not eat food after midnight the night before surgery.  No gum chewing, lozengers or hard candies.  You may however, drink CLEAR liquids up to 2 hours before you are scheduled to arrive for your surgery. Do not drink anything within 2 hours of your scheduled arrival time.  Clear liquids include: - water  - apple juice without pulp - gatorade (not RED colors) - black coffee or tea (Do NOT add milk or creamers to the coffee or tea) Do NOT drink anything that is not on this list.  DO NOT TAKE THESE MEDICATIONS THE MORNING OF SURGERY   One week prior to surgery: starting January 11 Stop Anti-inflammatories (NSAIDS) such as Advil, Aleve, Ibuprofen, Motrin, Naproxen, Naprosyn and Aspirin based products such as Excedrin, Goodys Powder, BC Powder. Stop ANY OVER THE COUNTER supplements until after surgery. You may however, continue to take Tylenol if needed for pain up until the day of surgery.  No Alcohol for 24 hours before or after surgery.  No Smoking including e-cigarettes for 24 hours prior to surgery.  No chewable tobacco products for at least 6 hours prior to surgery.  No nicotine patches on the day of surgery.  Do not use any "recreational" drugs for at least a week prior to your surgery.  Please be advised that the combination of cocaine and anesthesia may have negative outcomes, up to and including death. If you  test positive for cocaine, your surgery will be cancelled.  On the morning of surgery brush your teeth with toothpaste and water, you may rinse your mouth with mouthwash if you wish. Do not swallow any toothpaste or mouthwash.  Use CHG Soap as directed on instruction sheet.  Do not wear jewelry, make-up, hairpins, clips or nail polish.  Do not wear lotions, powders, or perfumes.   Do not shave body from the neck down 48 hours prior to surgery just in case you cut yourself which could leave a site for infection.  Also, freshly shaved skin may become irritated if using the CHG soap.  Contact lenses, hearing aids and dentures may not be worn into surgery.  Do not bring valuables to the hospital. [facility] is not responsible for any missing/lost belongings or valuables.   Notify your doctor if there is any change in your medical condition (cold, fever, infection).  Wear comfortable clothing (specific to your surgery type) to the hospital.  After surgery, you can help prevent lung complications by doing breathing exercises.  Take deep breaths and cough every 1-2 hours. Your doctor may order a device called an Incentive Spirometer to help you take deep breaths. When coughing or sneezing, hold a pillow firmly against your incision with both hands. This is called "splinting." Doing this helps protect your incision. It also decreases belly discomfort.  If you are being discharged the day of surgery, you will not be allowed to drive home. You will  need a responsible adult (18 years or older) to drive you home and stay with you that night.   If you are taking public transportation, you will need to have a responsible adult (18 years or older) with you. Please confirm with your physician that it is acceptable to use public transportation.   Please call the Pre-admissions Testing Dept. at [phone] if you have any questions about these instructions.  Surgery Visitation Policy:  Patients  undergoing a surgery or procedure may have two family members or support persons with them as long as the person is not COVID-19 positive or experiencing its symptoms.      Preparing for Surgery with CHLORHEXIDINE GLUCONATE (CHG) Soap  Chlorhexidine Gluconate (CHG) Soap  o An antiseptic cleaner that kills germs and bonds with the skin to continue killing germs even after washing  o Used for showering the night before surgery and morning of surgery  Before surgery, you can play an important role by reducing the number of germs on your skin.  CHG (Chlorhexidine gluconate) soap is an antiseptic cleanser which kills germs and bonds with the skin to continue killing germs even after washing.  Please do not use if you have an allergy to CHG or antibacterial soaps. If your skin becomes reddened/irritated stop using the CHG.  1. Shower the NIGHT BEFORE SURGERY and the MORNING OF SURGERY with CHG soap.  2. If you choose to wash your hair, wash your hair first as usual with your normal shampoo.  3. After shampooing, rinse your hair and body thoroughly to remove the shampoo.  4. Use CHG as you would any other liquid soap. You can apply CHG directly to the skin and wash gently with a scrungie or a clean washcloth.  5. Apply the CHG soap to your body only from the neck down. Do not use on open wounds or open sores. Avoid contact with your eyes, ears, mouth, and genitals (private parts). Wash face and genitals (private parts) with your normal soap.  6. Wash thoroughly, paying special attention to the area where your surgery will be performed.  7. Thoroughly rinse your body with warm water.  8. Do not shower/wash with your normal soap after using and rinsing off the CHG soap.  9. Pat yourself dry with a clean towel.  10. Wear clean pajamas to bed the night before surgery.  12. Place clean sheets on your bed the night of your first shower and do not sleep with pets.  13. Shower again with the  CHG soap on the day of surgery prior to arriving at the hospital.  14. Do not apply any deodorants/lotions/powders.  15. Please wear clean clothes to the hospital.    Su trmite est programado para el: jueves 16 de enero [place] en el mostrador de Chartered certified accountant piso del [facility] Para saber su hora de llegada, llame al [phone] entre la 1:00 p. m. y las 3:00 p. m. el mircoles 15 de enero. Si su hora de llegada es las 6:00 a. m., no llegue antes de esa hora ya que las puertas de [place] del [facility] no se abren [employer] 6:00 a. m.  RECORDAR: Las instrucciones que no se siguen completamente pueden generar riesgos mdicos graves, que pueden llegar hasta la [place]; o, segn el criterio de su cirujano y Scientific laboratory technician, es posible que sea Aeronautical engineer su Leisure centre manager.  No ingiera alimentos despus de la medianoche del da anterior a la ciruga. No mascar  chicle, pastillas ni caramelos duros.  Sin embargo, puede beber lquidos [employer] 2 horas antes de la fecha prevista de llegada a la [place]. No beba nada dentro de las 2 horas anteriores a su hora de Psychologist, sport and exercise.  Los lquidos claros incluyen: - agua - jugo de manzana sin pulpa - gatorade (no colores ROJOS) - caf o t negro (NO agregue leche ni cremas al caf o t) NO beba nada que no est en esta lista.  NO TOMAR ESTOS MEDICAMENTOS LA [place] DE LA CIRUGA  Una semana antes de la ciruga: a partir del 11 de enero Detenga los antiinflamatorios (AINE) como Advil, Aleve, Ibuprofeno, Motrin, Naproxen, Naprosyn y productos a base de aspirina como Excedrin, Goodys Powder, [employer]. Suspenda CUALQUIER suplemento de venta libre hasta despus de la [place]. Sin embargo, puede Clinical biochemist tomando Tylenol si es necesario para Audiological scientist de la [place].  No consumir alcohol durante 24 horas antes o despus de la [place].  No fumar, incluidos los cigarrillos electrnicos, durante las 24 horas  previas a la [place]. No consumir productos de tabaco masticables durante al menos 6 horas antes de la [place]. Sin parches de Special educational needs teacher de la [place].  No utilice ninguna droga "recreativa" durante al menos una semana antes de la [place]. Tenga en cuenta que la combinacin de cocana y anestesia puede [employer], que pueden llegar hasta la [place]. Si su prueba de cocana da positivo, su ciruga ser cancelada.  La maana de la ciruga cepille sus dientes con pasta dental y agua, puede enjuagarse la boca con enjuague bucal si lo desea. No ingiera pasta de dientes ni enjuague bucal.  Utilice el jabn CHG como se indica en la hoja de instrucciones.  No use joyas, maquillaje, horquillas, clips ni esmalte de uas.  No use lociones, polvos ni perfumes.  No se afeite el cuerpo desde el cuello hacia abajo 48 horas antes de la ciruga en caso de que se corte, lo que podra dejar un sitio de infeccin. Adems, la piel recin afeitada puede irritarse si se utiliza el jabn CHG.  No se pueden usar lentes de contacto, audfonos ni dentaduras postizas durante la [place].  No lleve objetos de valor al hospital. [facility] no es responsable de ninguna pertenencia u objeto de valor perdido o perdido.  Notifique a su mdico si hay algn cambio en su condicin mdica (resfriado, fiebre, infeccin).  Lleve ropa cmoda (especfica para su tipo de [place]) al hospital.  Despus de la ciruga, usted puede ayudar a prevenir complicaciones pulmonares haciendo ejercicios de respiracin. Respire profundamente y tosa cada 1 o 2 horas. Su mdico puede indicarle un dispositivo llamado espirmetro incentivador para ayudarle a respirar profundamente. Al toser o Brewing technologist, sostenga firmemente una almohada contra la incisin con ambas manos. Esto se llama "ferulizacin". Hacer esto ayuda a proteger su incisin. [place] molestias abdominales.  Si le dan el alta el da de la  [place], no se le permitir conducir a casa. Necesitar que un adulto responsable (de 18 aos o ms) lo lleve a casa y se quede con usted esa noche.  Si viaja en transporte pblico, deber ir acompaado de un adulto responsable (de 18 aos o ms). Confirme con su mdico que es aceptable utilizar el transporte pblico.  Llame al Departamento de pruebas previas a la admisin al [phone] si tiene alguna pregunta sobre estas instrucciones.  Poltica de visitas a ciruga:  [employer] se someten a Furniture conservator/restorer  ciruga o procedimiento pueden [employer] o personas de apoyo con ellos, siempre y cuando la persona no sea positiva para COVID-19 ni experimente sus sntomas.

## 2022-05-07 ENCOUNTER — Ambulatory Visit: Admission: RE | Admit: 2022-05-07 | Payer: Self-pay | Source: Home / Self Care | Admitting: Surgery

## 2022-05-07 ENCOUNTER — Encounter: Admission: RE | Payer: Self-pay | Source: Home / Self Care

## 2022-05-07 SURGERY — CHOLECYSTECTOMY, ROBOT-ASSISTED, LAPAROSCOPIC
Anesthesia: General

## 2022-06-01 ENCOUNTER — Other Ambulatory Visit: Payer: Self-pay

## 2022-06-01 ENCOUNTER — Telehealth: Payer: Self-pay | Admitting: Surgery

## 2022-06-01 ENCOUNTER — Ambulatory Visit (INDEPENDENT_AMBULATORY_CARE_PROVIDER_SITE_OTHER): Payer: Self-pay | Admitting: Surgery

## 2022-06-01 ENCOUNTER — Encounter: Payer: Self-pay | Admitting: Surgery

## 2022-06-01 VITALS — BP 111/71 | HR 67 | Temp 97.7°F | Ht 59.0 in | Wt 168.0 lb

## 2022-06-01 DIAGNOSIS — K8020 Calculus of gallbladder without cholecystitis without obstruction: Secondary | ICD-10-CM

## 2022-06-01 NOTE — Telephone Encounter (Signed)
Left message for patient to call, please inform her of the following regarding scheduled surgery with Dr. Hampton Abbot  Need spanish interpreter.       Pre-Admission date/time, and Surgery date at [facility].  Surgery Date: 06/10/22 Preadmission Testing Date: 06/04/22 (phone 8a-1p)  Also patient will need to call at [phone], between 1-3:00pm the day before surgery, to find out what time to arrive for surgery.

## 2022-06-01 NOTE — Progress Notes (Signed)
06/01/2022  History of Present Illness: Danielle Ball is a 32 y.o. female presenting for follow-up of symptomatic cholelithiasis.  Patient was last seen on 04/24/2022 for this and she was originally scheduled for robotic cholecystectomy on 05/07/2022.  However the patient canceled due to viral illness.  Reports that now she is feeling much better and has fully recovered from her cold.  Reports that she still having intermittent episodes of milder right upper quadrant pain.  Has not had any episodes that were as severe as the 1 that brought her to the emergency room.  Past Medical History: Past Medical History:  Diagnosis Date   Obesity (BMI 30.0-34.9)    Seizures ([place])    febrile seizure around age 63; another during hernia surgery age 59   Symptomatic cholelithiasis    Vaginal Pap smear, abnormal      Past Surgical History: Past Surgical History:  Procedure Laterality Date   HERNIA REPAIR Left 1998   Left inguinal hernia; had seizure during   NASAL SEPTUM SURGERY  2005   NASAL SEPTUM SURGERY  2013   STRABISMUS SURGERY Right 1994    Home Medications: Prior to Admission medications   Medication Sig Start Date End Date Taking? Authorizing Provider  acetaminophen (TYLENOL) 325 MG tablet Take 650 mg by mouth every 6 (six) hours as needed.   Yes [provider]  ondansetron (ZOFRAN-ODT) 4 MG disintegrating tablet Take 1 tablet (4 mg total) by mouth every 8 (eight) hours as needed for nausea or vomiting. 04/20/22  Yes Nathaniel Man, MD    Allergies: Allergies  Allergen Reactions   Metoclopramide Palpitations    Rapid heart rate - used in [place] as Metocloprina    Review of Systems: Review of Systems  Constitutional:  Negative for chills and fever.  HENT:  Negative for hearing loss.   Respiratory:  Negative for shortness of breath.   Cardiovascular:  Negative for chest pain.  Gastrointestinal:  Positive for abdominal pain. Negative for nausea and vomiting.   Genitourinary:  Negative for dysuria.  Musculoskeletal:  Negative for myalgias.  Skin:  Negative for rash.  Neurological:  Negative for dizziness.  Psychiatric/Behavioral:  Negative for depression.     Physical Exam BP 111/71   Pulse 67   Temp 97.7 F (36.5 C) (Oral)   Ht 4' 11"$  (1.499 m)   Wt 168 lb (76.2 kg)   SpO2 97%   BMI 33.93 kg/m  CONSTITUTIONAL: No acute distress, well-nourished HEENT:  Normocephalic, atraumatic, extraocular motion intact. NECK: Trachea is midline, no jugular venous distention. RESPIRATORY:  Lungs are clear, and breath sounds are equal bilaterally. Normal respiratory effort without pathologic use of accessory muscles. CARDIOVASCULAR: Heart is regular without murmurs, gallops, or rubs. GI: The abdomen is soft, nondistended, with some discomfort to palpation in the epigastric and right upper quadrant areas.  Negative Murphy's sign.  MUSCULOSKELETAL: Normal gait, no peripheral edema. NEUROLOGIC:  Motor and sensation is grossly normal.  Cranial nerves are grossly intact. PSYCH:  Alert and oriented to person, place and time. Affect is normal.  Labs/Imaging: Ultrasound RUQ on 04/20/2022: IMPRESSION: 1. Small amount of biliary sludge and gallstone noted in the gallbladder. However, no findings to suggest an acute cholecystitis are noted on today's examination.  Labs from 04/20/2022: Sodium 134, potassium 3.9, chloride 107, CO2 22, BUN 13, creatinine 0.7.  Total bilirubin 0.6, AST 25, ALT 20, alkaline phosphatase 68, albumin 3.6.  WBC 7.9, hemoglobin 13.1, hematocrit 39.8, platelets 221.  Assessment and Plan: This  is a 32 y.o. female with symptomatic cholelithiasis.  - Patient reports that she has been feeling much better and has recovered from her viral illness.  She is ready to schedule surgery again.  Discussed with her again the plan for robotic assisted cholecystectomy and reviewed the surgery at length again with her including the planned incisions, the  risks of bleeding, infection, injury to surrounding structures, the use of ICG to better evaluate the biliary anatomy, that this would be an outpatient surgery, postoperative activity restrictions, and she is willing to proceed. - We will schedule the surgery again for 06/10/2022. - All of her questions have been answered.  I spent 30 minutes dedicated to the care of this patient on the date of this encounter to include pre-visit review of records, face-to-face time with the patient discussing diagnosis and management, and any post-visit coordination of care.   Melvyn Neth, [facility]

## 2022-06-01 NOTE — Patient Instructions (Signed)
Our surgery scheduler willGallbladder Eating Plan High blood cholesterol, obesity, a sedentary lifestyle, an unhealthy diet, and diabetes are risk factors for developing gallstones. If you have a gallbladder condition, you may have trouble digesting fats and tolerating high fat intake. Eating a low-fat diet can help reduce your symptoms and may be helpful before and after having surgery to remove your gallbladder (cholecystectomy). Your health care provider may recommend that you work with a dietitian to help you reduce the amount of fat in your diet. What are tips for following this plan? General guidelines Limit your fat intake to less than 30% of your total daily calories. If you eat around 1,800 calories each day, this means eating less than 60 grams (g) of fat per day. Fat is an important part of a healthy diet. Eating a low-fat diet can make it hard to maintain a healthy body weight. Ask your dietitian how much fat, calories, and other nutrients you need each day. Eat small, frequent meals throughout the day instead of three large meals. Drink at least 8-10 cups (1.9-2.4 L) of fluid a day. Drink enough fluid to keep your urine pale yellow. If you drink alcohol: Limit how much you have to: 0-1 drink a day for women who are not pregnant. 0-2 drinks a day for men. Know how much alcohol is in a drink. In the [place] one drink equals one 12 oz bottle of beer (355 mL), one 5 oz glass of wine (148 mL), or one 1 oz glass of hard liquor (44 mL). Reading food labels  Check nutrition facts on food labels for the amount of fat per serving. Choose foods with less than 3 grams of fat per serving. Shopping Choose nonfat and low-fat healthy foods. Look for the words "nonfat," "low-fat," or "fat-free." Avoid buying processed or prepackaged foods. Cooking Cook using low-fat methods, such as baking, broiling, grilling, or boiling. Cook with small amounts of healthy fats, such as olive oil, grapeseed oil,  canola oil, avocado oil, or sunflower oil. What foods are recommended?  All fresh, frozen, or canned fruits and vegetables. Whole grains. Low-fat or nonfat (skim) milk and yogurt. Lean meat, skinless poultry, fish, eggs, and beans. Low-fat protein supplement powders or drinks. Spices and herbs. The items listed above may not be a complete list of foods and beverages you can eat and drink. Contact a dietitian for more information. What foods are not recommended? High-fat foods. These include baked goods, fast food, fatty cuts of meat, ice cream, french toast, sweet rolls, pizza, cheese bread, foods covered with butter, creamy sauces, or cheese. Fried foods. These include french fries, tempura, battered fish, breaded chicken, fried breads, and sweets. Foods that cause bloating and gas. The items listed above may not be a complete list of foods that you should avoid. Contact a dietitian for more information. Summary A low-fat diet can be helpful if you have a gallbladder condition, or before and after gallbladder surgery. Limit your fat intake to less than 30% of your total daily calories. This is about 60 g of fat if you eat 1,800 calories each day. Eat small, frequent meals throughout the day instead of three large meals. This information is not intended to replace advice given to you by your health care provider. Make sure you discuss any questions you have with your health care provider. Document Revised: 03/21/2021 Document Reviewed: 03/21/2021 Elsevier Patient Education  [place].  call you within 24-48 hours to schedule your surgery. Please have the  Blue surgery sheet available when speaking with her.   Minimally Invasive Cholecystectomy Minimally invasive cholecystectomy is surgery to remove the gallbladder. The gallbladder is a pear-shaped organ that lies beneath the liver on the right side of the body. The gallbladder stores bile, which is a fluid that helps the body digest  fats. Cholecystectomy is often done to treat inflammation (irritation and swelling) of the gallbladder (cholecystitis). This condition is usually caused by a buildup of gallstones (cholelithiasis) in the gallbladder or when the fluid in the gall bladder becomes stagnant because gallstones get stuck in the ducts (tubes) and block the flow of bile. This can result in inflammation and pain. In severe cases, emergency surgery may be required. This procedure is done through small incisions in the abdomen, instead of one large incision. It is also called laparoscopic surgery. A thin scope with a camera (laparoscope) is inserted through one incision. Then surgical instruments are inserted through the other incisions. In some cases, a minimally invasive surgery may need to be changed to a surgery that is done through a larger incision. This is called open surgery. Tell a health care provider about: Any allergies you have. All medicines you are taking, including vitamins, herbs, eye drops, creams, and over-the-counter medicines. Any problems you or family members have had with anesthetic medicines. Any bleeding problems you have. Any surgeries you have had. Any medical conditions you have. Whether you are pregnant or may be pregnant. What are the risks? Generally, this is a safe procedure. However, problems may occur, including: Infection. Bleeding. Allergic reactions to medicines. Damage to nearby structures or organs. A gallstone remaining in the common bile duct. The common bile duct carries bile from the gallbladder to the small intestine. A bile leak from the liver or cystic duct after your gallbladder is removed. What happens before the procedure? When to stop eating and drinking Follow instructions from your health care provider about what you may eat and drink before your procedure. These may include: 8 hours before the procedure Stop eating most foods. Do not eat meat, fried foods, or fatty  foods. Eat only light foods, such as toast or crackers. All liquids are okay except energy drinks and alcohol. 6 hours before the procedure Stop eating. Drink only clear liquids, such as water, clear fruit juice, black coffee, plain tea, and sports drinks. Do not drink energy drinks or alcohol. 2 hours before the procedure Stop drinking all liquids. You may be allowed to take medicines with small sips of water. If you do not follow your health care provider's instructions, your procedure may be delayed or canceled. Medicines Ask your health care provider about: Changing or stopping your regular medicines. This is especially important if you are taking diabetes medicines or blood thinners. Taking medicines such as aspirin and ibuprofen. These medicines can thin your blood. Do not take these medicines unless your health care provider tells you to take them. Taking over-the-counter medicines, vitamins, herbs, and supplements. General instructions If you will be going home right after the procedure, plan to have a responsible adult: Take you home from the hospital or clinic. You will not be allowed to drive. Care for you for the time you are told. Do not use any products that contain nicotine or tobacco for at least 4 weeks before the procedure. These products include cigarettes, chewing tobacco, and vaping devices, such as e-cigarettes. If you need help quitting, ask your health care provider. Ask your health care provider: How your surgery site  will be marked. What steps will be taken to help prevent infection. These may include: Removing hair at the surgery site. Washing skin with a germ-killing soap. Taking antibiotic medicine. What happens during the procedure?  An IV will be inserted into one of your veins. You will be given one or both of the following: A medicine to help you relax (sedative). A medicine to make you fall asleep (general anesthetic). Your surgeon will make several  small incisions in your abdomen. The laparoscope will be inserted through one of the small incisions. The camera on the laparoscope will send images to a monitor in the operating room. This lets your surgeon see inside your abdomen. A gas will be pumped into your abdomen. This will expand your abdomen to give the surgeon more room to perform the surgery. Other tools that are needed for the procedure will be inserted through the other incisions. The gallbladder will be removed through one of the incisions. Your common bile duct may be examined. If stones are found in the common bile duct, they may be removed. After your gallbladder has been removed, the incisions will be closed with stitches (sutures), staples, or skin glue. Your incisions will be covered with a bandage (dressing). The procedure may vary among health care providers and hospitals. What happens after the procedure? Your blood pressure, heart rate, breathing rate, and blood oxygen level will be monitored until you leave the hospital or clinic. You will be given medicines as needed to control your pain. You may have a drain placed in the incision. The drain will be removed a day or two after the procedure. Summary Minimally invasive cholecystectomy, also called laparoscopic cholecystectomy, is surgery to remove the gallbladder using small incisions. Tell your health care provider about all the medical conditions you have and all the medicines you are taking for those conditions. Before the procedure, follow instructions about when to stop eating and drinking and changing or stopping medicines. Plan to have a responsible adult care for you for the time you are told after you leave the hospital or clinic. This information is not intended to replace advice given to you by your health care provider. Make sure you discuss any questions you have with your health care provider. Document Revised: 10/08/2020 Document Reviewed:  10/08/2020 Elsevier Patient Education  [place].

## 2022-06-01 NOTE — H&P (View-Only) (Signed)
06/01/2022  History of Present Illness: Danielle Ball is a 32 y.o. female presenting for follow-up of symptomatic cholelithiasis.  Patient was last seen on 04/24/2022 for this and she was originally scheduled for robotic cholecystectomy on 05/07/2022.  However the patient canceled due to viral illness.  Reports that now she is feeling much better and has fully recovered from her cold.  Reports that she still having intermittent episodes of milder right upper quadrant pain.  Has not had any episodes that were as severe as the 1 that brought her to the emergency room.  Past Medical History: Past Medical History:  Diagnosis Date   Obesity (BMI 30.0-34.9)    Seizures ([place])    febrile seizure around age 43; another during hernia surgery age 41   Symptomatic cholelithiasis    Vaginal Pap smear, abnormal      Past Surgical History: Past Surgical History:  Procedure Laterality Date   HERNIA REPAIR Left 1998   Left inguinal hernia; had seizure during   NASAL SEPTUM SURGERY  2005   NASAL SEPTUM SURGERY  2013   STRABISMUS SURGERY Right 1994    Home Medications: Prior to Admission medications   Medication Sig Start Date End Date Taking? Authorizing Provider  acetaminophen (TYLENOL) 325 MG tablet Take 650 mg by mouth every 6 (six) hours as needed.   Yes [provider]  ondansetron (ZOFRAN-ODT) 4 MG disintegrating tablet Take 1 tablet (4 mg total) by mouth every 8 (eight) hours as needed for nausea or vomiting. 04/20/22  Yes Nathaniel Man, MD    Allergies: Allergies  Allergen Reactions   Metoclopramide Palpitations    Rapid heart rate - used in [place] as Metocloprina    Review of Systems: Review of Systems  Constitutional:  Negative for chills and fever.  HENT:  Negative for hearing loss.   Respiratory:  Negative for shortness of breath.   Cardiovascular:  Negative for chest pain.  Gastrointestinal:  Positive for abdominal pain. Negative for nausea and vomiting.   Genitourinary:  Negative for dysuria.  Musculoskeletal:  Negative for myalgias.  Skin:  Negative for rash.  Neurological:  Negative for dizziness.  Psychiatric/Behavioral:  Negative for depression.     Physical Exam BP 111/71   Pulse 67   Temp 97.7 F (36.5 C) (Oral)   Ht 4' 11"$  (1.499 m)   Wt 168 lb (76.2 kg)   SpO2 97%   BMI 33.93 kg/m  CONSTITUTIONAL: No acute distress, well-nourished HEENT:  Normocephalic, atraumatic, extraocular motion intact. NECK: Trachea is midline, no jugular venous distention. RESPIRATORY:  Lungs are clear, and breath sounds are equal bilaterally. Normal respiratory effort without pathologic use of accessory muscles. CARDIOVASCULAR: Heart is regular without murmurs, gallops, or rubs. GI: The abdomen is soft, nondistended, with some discomfort to palpation in the epigastric and right upper quadrant areas.  Negative Murphy's sign.  MUSCULOSKELETAL: Normal gait, no peripheral edema. NEUROLOGIC:  Motor and sensation is grossly normal.  Cranial nerves are grossly intact. PSYCH:  Alert and oriented to person, place and time. Affect is normal.  Labs/Imaging: Ultrasound RUQ on 04/20/2022: IMPRESSION: 1. Small amount of biliary sludge and gallstone noted in the gallbladder. However, no findings to suggest an acute cholecystitis are noted on today's examination.  Labs from 04/20/2022: Sodium 134, potassium 3.9, chloride 107, CO2 22, BUN 13, creatinine 0.7.  Total bilirubin 0.6, AST 25, ALT 20, alkaline phosphatase 68, albumin 3.6.  WBC 7.9, hemoglobin 13.1, hematocrit 39.8, platelets 221.  Assessment and Plan: This  is a 32 y.o. female with symptomatic cholelithiasis.  - Patient reports that she has been feeling much better and has recovered from her viral illness.  She is ready to schedule surgery again.  Discussed with her again the plan for robotic assisted cholecystectomy and reviewed the surgery at length again with her including the planned incisions, the  risks of bleeding, infection, injury to surrounding structures, the use of ICG to better evaluate the biliary anatomy, that this would be an outpatient surgery, postoperative activity restrictions, and she is willing to proceed. - We will schedule the surgery again for 06/10/2022. - All of her questions have been answered.  I spent 30 minutes dedicated to the care of this patient on the date of this encounter to include pre-visit review of records, face-to-face time with the patient discussing diagnosis and management, and any post-visit coordination of care.   Melvyn Neth, [facility]

## 2022-06-04 ENCOUNTER — Inpatient Hospital Stay: Admission: RE | Admit: 2022-06-04 | Payer: Self-pay | Source: Ambulatory Visit

## 2022-06-08 ENCOUNTER — Encounter
Admission: RE | Admit: 2022-06-08 | Discharge: 2022-06-08 | Disposition: A | Discharge status: Home / Self Care | Payer: Self-pay | Source: Ambulatory Visit | Attending: Surgery | Admitting: Surgery

## 2022-06-08 DIAGNOSIS — Z01818 Encounter for other preprocedural examination: Secondary | ICD-10-CM

## 2022-06-10 ENCOUNTER — Ambulatory Visit: Payer: Self-pay | Admitting: Anesthesiology

## 2022-06-10 ENCOUNTER — Other Ambulatory Visit: Payer: Self-pay

## 2022-06-10 ENCOUNTER — Encounter
Admission: RE | Disposition: A | Discharge status: Home / Self Care | Payer: Self-pay | Source: Home / Self Care | Attending: Surgery

## 2022-06-10 ENCOUNTER — Encounter: Payer: Self-pay | Admitting: Surgery

## 2022-06-10 ENCOUNTER — Ambulatory Visit
Admission: RE | Admit: 2022-06-10 | Discharge: 2022-06-10 | Disposition: A | Discharge status: Home / Self Care | Payer: Self-pay | Attending: Surgery | Admitting: Surgery

## 2022-06-10 DIAGNOSIS — K8020 Calculus of gallbladder without cholecystitis without obstruction: Secondary | ICD-10-CM

## 2022-06-10 DIAGNOSIS — E669 Obesity, unspecified: Secondary | ICD-10-CM | POA: Insufficient documentation

## 2022-06-10 DIAGNOSIS — K8010 Calculus of gallbladder with chronic cholecystitis without obstruction: Secondary | ICD-10-CM | POA: Insufficient documentation

## 2022-06-10 DIAGNOSIS — Z6831 Body mass index (BMI) 31.0-31.9, adult: Secondary | ICD-10-CM | POA: Insufficient documentation

## 2022-06-10 DIAGNOSIS — Z01818 Encounter for other preprocedural examination: Secondary | ICD-10-CM

## 2022-06-10 DIAGNOSIS — Z87891 Personal history of nicotine dependence: Secondary | ICD-10-CM | POA: Insufficient documentation

## 2022-06-10 DIAGNOSIS — K429 Umbilical hernia without obstruction or gangrene: Secondary | ICD-10-CM

## 2022-06-10 HISTORY — PX: UMBILICAL HERNIA REPAIR: SHX196

## 2022-06-10 SURGERY — CHOLECYSTECTOMY, ROBOT-ASSISTED, LAPAROSCOPIC
Anesthesia: General | Site: Abdomen

## 2022-06-10 MED ORDER — SODIUM CHLORIDE 0.9 % IR SOLN
Status: DC | PRN
  Administered 2022-06-10: 1000 mL

## 2022-06-10 MED ORDER — EPHEDRINE SULFATE (PRESSORS) 50 MG/ML IJ SOLN
INTRAMUSCULAR | Status: DC | PRN
  Administered 2022-06-10 (×2): 5 mg via INTRAVENOUS

## 2022-06-10 MED ORDER — DEXMEDETOMIDINE HCL IN NACL 80 MCG/20ML IV SOLN
INTRAVENOUS | Status: AC
  Filled 2022-06-10: qty 20

## 2022-06-10 MED ORDER — MIDAZOLAM HCL 2 MG/2ML IJ SOLN
INTRAMUSCULAR | Status: DC | PRN
  Administered 2022-06-10: 2 mg via INTRAVENOUS

## 2022-06-10 MED ORDER — KETOROLAC TROMETHAMINE 30 MG/ML IJ SOLN
INTRAMUSCULAR | Status: DC | PRN
  Administered 2022-06-10: 30 mg via INTRAVENOUS

## 2022-06-10 MED ORDER — OXYCODONE HCL 5 MG PO TABS: 5.0000 mg | ORAL_TABLET | Freq: Once | ORAL | Status: AC | PRN

## 2022-06-10 MED ORDER — DEXMEDETOMIDINE HCL IN NACL 80 MCG/20ML IV SOLN
INTRAVENOUS | Status: DC | PRN
  Administered 2022-06-10: 12 ug via BUCCAL

## 2022-06-10 MED ORDER — BUPIVACAINE LIPOSOME 1.3 % IJ SUSP: 20.0000 mL | Freq: Once | INTRAMUSCULAR | Status: DC

## 2022-06-10 MED ORDER — CEFAZOLIN SODIUM-DEXTROSE 2-4 GM/100ML-% IV SOLN
2.0000 g | INTRAVENOUS | Status: AC
  Administered 2022-06-10: 2 g via INTRAVENOUS

## 2022-06-10 MED ORDER — ACETAMINOPHEN 10 MG/ML IV SOLN: 1000.0000 mg | Freq: Once | INTRAVENOUS | Status: DC | PRN

## 2022-06-10 MED ORDER — CHLORHEXIDINE GLUCONATE 0.12 % MT SOLN
15.0000 mL | Freq: Once | OROMUCOSAL | Status: AC
  Administered 2022-06-10: 15 mL via OROMUCOSAL

## 2022-06-10 MED ORDER — FENTANYL CITRATE (PF) 100 MCG/2ML IJ SOLN
INTRAMUSCULAR | Status: AC
  Filled 2022-06-10: qty 2

## 2022-06-10 MED ORDER — BUPIVACAINE HCL (PF) 0.25 % IJ SOLN
INTRAMUSCULAR | Status: AC
  Filled 2022-06-10: qty 30

## 2022-06-10 MED ORDER — IBUPROFEN 800 MG PO TABS: 800.0000 mg | ORAL_TABLET | Freq: Three times a day (TID) | ORAL | 1 refills | Status: AC | PRN

## 2022-06-10 MED ORDER — SUGAMMADEX SODIUM 200 MG/2ML IV SOLN
INTRAVENOUS | Status: DC | PRN
  Administered 2022-06-10: 200 mg via INTRAVENOUS

## 2022-06-10 MED ORDER — ONDANSETRON HCL 4 MG/2ML IJ SOLN
INTRAMUSCULAR | Status: AC
  Filled 2022-06-10: qty 2

## 2022-06-10 MED ORDER — LACTATED RINGERS IV SOLN: INTRAVENOUS | Status: DC

## 2022-06-10 MED ORDER — ACETAMINOPHEN 500 MG PO TABS
1000.0000 mg | ORAL_TABLET | ORAL | Status: AC
  Administered 2022-06-10: 1000 mg via ORAL

## 2022-06-10 MED ORDER — OXYCODONE HCL 5 MG PO TABS
ORAL_TABLET | ORAL | Status: AC
  Administered 2022-06-10: 5 mg via ORAL
  Filled 2022-06-10: qty 1

## 2022-06-10 MED ORDER — MIDAZOLAM HCL 2 MG/2ML IJ SOLN
INTRAMUSCULAR | Status: AC
  Filled 2022-06-10: qty 2

## 2022-06-10 MED ORDER — ROCURONIUM BROMIDE 100 MG/10ML IV SOLN
INTRAVENOUS | Status: DC | PRN
  Administered 2022-06-10: 50 mg via INTRAVENOUS
  Administered 2022-06-10: 10 mg via INTRAVENOUS

## 2022-06-10 MED ORDER — DEXAMETHASONE SODIUM PHOSPHATE 10 MG/ML IJ SOLN
INTRAMUSCULAR | Status: AC
  Filled 2022-06-10: qty 1

## 2022-06-10 MED ORDER — FENTANYL CITRATE (PF) 100 MCG/2ML IJ SOLN
INTRAMUSCULAR | Status: DC | PRN
  Administered 2022-06-10 (×2): 50 ug via INTRAVENOUS

## 2022-06-10 MED ORDER — ORAL CARE MOUTH RINSE: 15.0000 mL | Freq: Once | OROMUCOSAL | Status: AC

## 2022-06-10 MED ORDER — LIDOCAINE HCL (PF) 2 % IJ SOLN
INTRAMUSCULAR | Status: AC
  Filled 2022-06-10: qty 5

## 2022-06-10 MED ORDER — ACETAMINOPHEN 500 MG PO TABS: 1000.0000 mg | ORAL_TABLET | Freq: Four times a day (QID) | ORAL | Status: AC | PRN

## 2022-06-10 MED ORDER — DEXAMETHASONE SODIUM PHOSPHATE 10 MG/ML IJ SOLN
INTRAMUSCULAR | Status: DC | PRN
  Administered 2022-06-10: 10 mg via INTRAVENOUS

## 2022-06-10 MED ORDER — FAMOTIDINE 20 MG PO TABS
20.0000 mg | ORAL_TABLET | Freq: Once | ORAL | Status: AC
  Administered 2022-06-10: 20 mg via ORAL

## 2022-06-10 MED ORDER — FENTANYL CITRATE (PF) 100 MCG/2ML IJ SOLN
25.0000 ug | INTRAMUSCULAR | Status: DC | PRN
  Administered 2022-06-10 (×3): 25 ug via INTRAVENOUS

## 2022-06-10 MED ORDER — CHLORHEXIDINE GLUCONATE CLOTH 2 % EX PADS
6.0000 | MEDICATED_PAD | Freq: Once | CUTANEOUS | Status: AC
  Administered 2022-06-10: 6 via TOPICAL

## 2022-06-10 MED ORDER — ONDANSETRON HCL 4 MG/2ML IJ SOLN
INTRAMUSCULAR | Status: DC | PRN
  Administered 2022-06-10: 4 mg via INTRAVENOUS

## 2022-06-10 MED ORDER — BUPIVACAINE-EPINEPHRINE (PF) 0.25% -1:200000 IJ SOLN
INTRAMUSCULAR | Status: DC | PRN
  Administered 2022-06-10: 30 mL

## 2022-06-10 MED ORDER — EPINEPHRINE PF 1 MG/ML IJ SOLN
INTRAMUSCULAR | Status: AC
  Filled 2022-06-10: qty 1

## 2022-06-10 MED ORDER — INDOCYANINE GREEN 25 MG IV SOLR
2.5000 mg | INTRAVENOUS | Status: AC
  Administered 2022-06-10: 2.5 mg via INTRAVENOUS
  Filled 2022-06-10: qty 1

## 2022-06-10 MED ORDER — GABAPENTIN 300 MG PO CAPS
300.0000 mg | ORAL_CAPSULE | ORAL | Status: AC
  Administered 2022-06-10: 300 mg via ORAL

## 2022-06-10 MED ORDER — 0.9 % SODIUM CHLORIDE (POUR BTL) OPTIME
TOPICAL | Status: DC | PRN
  Administered 2022-06-10: 500 mL

## 2022-06-10 MED ORDER — FENTANYL CITRATE (PF) 100 MCG/2ML IJ SOLN
INTRAMUSCULAR | Status: AC
  Administered 2022-06-10: 25 ug via INTRAVENOUS
  Filled 2022-06-10: qty 2

## 2022-06-10 MED ORDER — ONDANSETRON HCL 4 MG/2ML IJ SOLN: 4.0000 mg | Freq: Once | INTRAMUSCULAR | Status: DC | PRN

## 2022-06-10 MED ORDER — OXYCODONE HCL 5 MG PO TABS: 5.0000 mg | ORAL_TABLET | ORAL | 0 refills | Status: AC | PRN

## 2022-06-10 MED ORDER — LIDOCAINE HCL (CARDIAC) PF 100 MG/5ML IV SOSY
PREFILLED_SYRINGE | INTRAVENOUS | Status: DC | PRN
  Administered 2022-06-10: 60 mg via INTRAVENOUS

## 2022-06-10 MED ORDER — PROPOFOL 10 MG/ML IV BOLUS
INTRAVENOUS | Status: DC | PRN
  Administered 2022-06-10: 150 mg via INTRAVENOUS

## 2022-06-10 MED ORDER — OXYCODONE HCL 5 MG/5ML PO SOLN: 5.0000 mg | Freq: Once | ORAL | Status: AC | PRN

## 2022-06-10 MED ORDER — PROPOFOL 10 MG/ML IV BOLUS
INTRAVENOUS | Status: AC
  Filled 2022-06-10: qty 20

## 2022-06-10 SURGICAL SUPPLY — 56 items
BAG PRESSURE INF REUSE 1000 (BAG) IMPLANT
CANNULA CAP OBTURATR AIRSEAL 8 (CAP) IMPLANT
CANNULA REDUC XI 12-8 STAPL (CANNULA) ×1
CANNULA REDUCER 12-8 DVNC XI (CANNULA) ×1 IMPLANT
CLIP LIGATING HEMO O LOK GREEN (MISCELLANEOUS) ×1 IMPLANT
DERMABOND ADVANCED .7 DNX12 (GAUZE/BANDAGES/DRESSINGS) ×1 IMPLANT
DRAPE ARM DVNC X/XI (DISPOSABLE) ×4 IMPLANT
DRAPE COLUMN DVNC XI (DISPOSABLE) ×1 IMPLANT
DRAPE DA VINCI XI ARM (DISPOSABLE) ×4
DRAPE DA VINCI XI COLUMN (DISPOSABLE) ×1
ELECT CAUTERY BLADE TIP 2.5 (TIP) ×1
ELECT REM PT RETURN 9FT ADLT (ELECTROSURGICAL) ×1
ELECTRODE CAUTERY BLDE TIP 2.5 (TIP) ×1 IMPLANT
ELECTRODE REM PT RTRN 9FT ADLT (ELECTROSURGICAL) ×1 IMPLANT
GLOVE SURG SYN 7.0 (GLOVE) ×2 IMPLANT
GLOVE SURG SYN 7.0 PF PI (GLOVE) ×2 IMPLANT
GLOVE SURG SYN 7.5  E (GLOVE) ×2
GLOVE SURG SYN 7.5 E (GLOVE) ×2 IMPLANT
GLOVE SURG SYN 7.5 PF PI (GLOVE) ×2 IMPLANT
GOWN STRL REUS W/ TWL LRG LVL3 (GOWN DISPOSABLE) ×4 IMPLANT
GOWN STRL REUS W/TWL LRG LVL3 (GOWN DISPOSABLE) ×4
IRRIGATOR SUCT 8 DISP DVNC XI (IRRIGATION / IRRIGATOR) IMPLANT
IRRIGATOR SUCTION 8MM XI DISP (IRRIGATION / IRRIGATOR) ×1
IV NS 1000ML (IV SOLUTION)
IV NS 1000ML BAXH (IV SOLUTION) IMPLANT
KIT PINK PAD W/HEAD ARE REST (MISCELLANEOUS) ×1
KIT PINK PAD W/HEAD ARM REST (MISCELLANEOUS) ×1 IMPLANT
LABEL OR SOLS (LABEL) ×1 IMPLANT
MANIFOLD NEPTUNE II (INSTRUMENTS) ×1 IMPLANT
NEEDLE HYPO 22GX1.5 SAFETY (NEEDLE) ×1 IMPLANT
NS IRRIG 500ML POUR BTL (IV SOLUTION) ×1 IMPLANT
OBTURATOR OPTICAL STANDARD 8MM (TROCAR) ×1
OBTURATOR OPTICAL STND 8 DVNC (TROCAR) ×1
OBTURATOR OPTICALSTD 8 DVNC (TROCAR) ×1 IMPLANT
PACK LAP CHOLECYSTECTOMY (MISCELLANEOUS) ×1 IMPLANT
PENCIL SMOKE EVACUATOR (MISCELLANEOUS) ×1 IMPLANT
SEAL CANN UNIV 5-8 DVNC XI (MISCELLANEOUS) ×3 IMPLANT
SEAL XI 5MM-8MM UNIVERSAL (MISCELLANEOUS) ×3
SET TUBE FILTERED XL AIRSEAL (SET/KITS/TRAYS/PACK) IMPLANT
SET TUBE SMOKE EVAC HIGH FLOW (TUBING) ×1 IMPLANT
SOL ELECTROSURG ANTI STICK (MISCELLANEOUS) ×1
SOLUTION ELECTROSURG ANTI STCK (MISCELLANEOUS) ×1 IMPLANT
SPIKE FLUID TRANSFER (MISCELLANEOUS) ×1 IMPLANT
SPONGE T-LAP 18X18 ~~LOC~~+RFID (SPONGE) IMPLANT
SPONGE T-LAP 4X18 ~~LOC~~+RFID (SPONGE) ×1 IMPLANT
STAPLER CANNULA SEAL DVNC XI (STAPLE) ×1 IMPLANT
STAPLER CANNULA SEAL XI (STAPLE) ×1
SUT MNCRL AB 4-0 PS2 18 (SUTURE) ×1 IMPLANT
SUT VIC AB 2-0 SH 27 (SUTURE) ×1
SUT VIC AB 2-0 SH 27XBRD (SUTURE) IMPLANT
SUT VIC AB 3-0 SH 27 (SUTURE) ×1
SUT VIC AB 3-0 SH 27X BRD (SUTURE) IMPLANT
SUT VICRYL 0 UR6 27IN ABS (SUTURE) ×2 IMPLANT
SYS BAG RETRIEVAL 10MM (BASKET) ×1
SYSTEM BAG RETRIEVAL 10MM (BASKET) ×1 IMPLANT
WATER STERILE IRR 500ML POUR (IV SOLUTION) ×1 IMPLANT

## 2022-06-10 NOTE — Op Note (Signed)
Procedure Date:  06/10/2022  Pre-operative Diagnosis:  Symptomatic cholelithiasis  Post-operative Diagnosis: Symptomatic cholelithiasis, umbilical hernia of 1 cm  Procedure:  Robotic assisted cholecystectomy with ICG FireFly cholangiogram; open umbilical hernia repair.  Surgeon:  Melvyn Neth, MD  Anesthesia:  General endotracheal  Estimated Blood Loss:  20 ml  Specimens:  gallbladder  Complications:  None  Indications for Procedure:  This is a 32 y.o. female who presents with abdominal pain and workup revealing symptomatic cholelithiasis.  The benefits, complications, treatment options, and expected outcomes were discussed with the patient. The risks of bleeding, infection, recurrence of symptoms, failure to resolve symptoms, bile duct damage, bile duct leak, retained common bile duct stone, bowel injury, and need for further procedures were all discussed with the patient and she was willing to proceed.  Description of Procedure: The patient was correctly identified in the preoperative area and brought into the operating room.  The patient was placed supine with VTE prophylaxis in place.  Appropriate time-outs were performed.  Anesthesia was induced and the patient was intubated.  Appropriate antibiotics were infused.  The abdomen was prepped and draped in a sterile fashion. An infraumbilical incision was made. Cautery was used to dissect down to the umbilical stalk.  It was noted that the patient had a small umbilical hernia.  The stalk was separated from the fascia, revealing a 1 cm defect.  The fascial edges were cleared and a 12 mm port was inserted.  Pneumoperitoneum was obtained with appropriate opening pressures.  Three 8-mm ports were placed in the mid abdomen at the level of the umbilicus under direct visualization.  The DaVinci platform was docked, camera targeted, and instruments were placed under direct visualization.  The gallbladder was identified.  The fundus was  grasped and retracted cephalad.  Adhesions were lysed bluntly and with electrocautery. The infundibulum was grasped and retracted laterally, exposing the peritoneum overlying the gallbladder.  This was incised with electrocautery and extended on either side of the gallbladder.  FireFly cholangiogram was then obtained, and we were able to clearly identify the cystic duct and common bile duct with ICG flowing from liver to gallbladder and common bile duct and cystic duct going towards the duodenum.  No obstructions were noted.  The cystic duct and cystic artery were carefully dissected with combination of cautery and blunt dissection.  Both were clipped twice proximally and once distally, cutting in between.  The gallbladder was taken from the gallbladder fossa in a retrograde fashion with electrocautery. There was bile spillage while doing this.  The gallbladder was placed in an Endocatch bag. The liver bed was inspected and any bleeding was controlled with electrocautery. The right upper quadrant was then inspected again revealing intact clips, no bleeding, and no ductal injury.  The area was thoroughly irrigated.  The 8 mm ports were removed under direct visualization and the 12 mm port was removed.  The Endocatch bag was brought out via the umbilical incision. The hernia defect was closed using 0 vicryl suture.  Local anesthetic was infused in all incisions.  The umbilical stalk was reattached using 2-0 Vicryl.  The umbilical incision was closed with 3-0 Vicryl and 4-0 Monocryl, and the other port incisions were closed with 4-0 Monocryl.  The wounds were cleaned and sealed with DermaBond.  The patient was emerged from anesthesia and extubated and brought to the recovery room for further management.  The patient tolerated the procedure well and all counts were correct at the end of the  case.   Melvyn Neth, MD

## 2022-06-10 NOTE — Interval H&P Note (Signed)
History and Physical Interval Note:  06/10/2022 12:17 PM  Danielle Ball  has presented today for surgery, with the diagnosis of symptomatic cholelithiasis.  The various methods of treatment have been discussed with the patient and family. After consideration of risks, benefits and other options for treatment, the patient has consented to  Procedure(s): XI ROBOTIC ASSISTED LAPAROSCOPIC CHOLECYSTECTOMY (N/A) [place] (ICG) (N/A) as a surgical intervention.  The patient's history has been reviewed, patient examined, no change in status, stable for surgery.  I have reviewed the patient's chart and labs.  Questions were answered to the patient's satisfaction.     Lynora Dymond

## 2022-06-10 NOTE — Transfer of Care (Signed)
Immediate Anesthesia Transfer of Care Note  Patient: Danielle Ball  Procedure(s) Performed: XI ROBOTIC ASSISTED LAPAROSCOPIC CHOLECYSTECTOMY (Abdomen) INDOCYANINE GREEN FLUORESCENCE IMAGING (ICG) (Abdomen) HERNIA REPAIR UMBILICAL ADULT, OPEN (Abdomen)  Patient Location: PACU  Anesthesia Type:General  Level of Consciousness: awake and alert   Airway & Oxygen Therapy: Patient Spontanous Breathing and Patient connected to face mask oxygen  Post-op Assessment: Report given to RN and Post -op Vital signs reviewed and stable  Post vital signs: Reviewed and stable  Last Vitals:  Vitals Value Taken Time  BP 113/58 06/10/22 1454  Temp 36.1 C 06/10/22 1454  Pulse 78 06/10/22 1454  Resp 17 06/10/22 1454  SpO2 100 % 06/10/22 1454    Last Pain:  Vitals:   06/10/22 1100  TempSrc: Oral         Complications: No notable events documented.

## 2022-06-10 NOTE — Discharge Instructions (Addendum)
Discharge Instructions: 1.  Patient may shower, but do not scrub wounds heavily and dab dry only. 2.  Do not submerge wounds in pool/tub until fully healed. 3.  Do not apply ointments or hydrogen peroxide to the wounds. 4.  May apply ice packs to the wounds for comfort. 5.  Do not drive while taking narcotics for pain control.  Prior to driving, make sure you are able to rotate right and left to look at blindspots without significant pain or discomfort. 6.  No heavy lifting or pushing of more than 10-15 lbs for 4 weeks.  Instrucciones Instrucciones de descarga: 1. El paciente puede [place], pero no frotar [place] y secarlas nicamente con toques. 2. No sumerja las heridas en una piscina o baera hasta que hayan sanado por completo. 3. No aplique pomadas ni perxido de hidrgeno en las heridas. 4. Puede aplicar compresas de hielo a las heridas para mayor comodidad. 5. No conduzca mientras est tomando narcticos para Financial controller. Antes de Forensic psychologist, asegrese de poder girar hacia la derecha y hacia la izquierda para observar los puntos ciegos sin dolor o Haematologist. 6. No levantar ni empujar objetos pesados de ms de 10 a 15 libras durante 4 semanas.      CIRUGIA AMBULATORIA       Instruccionnes de alta      1.  Las drogas que se Statistician en su cuerpo [employer], asi            que por las proximas 24 horas usted no debe:   Conducir Scientist, research (medical)) un automovil   Hacer ninguna decision legal   Tomar ninguna bebida alcoholica  2.  A) [place] puede comenzar una dieta regular.  Es mejor que hoy empiece con           liquidos y gradualmente anada comidas solidas.       B) Puede comer cualquier comida que desee pero es mejor empezar con liquidos,                      luego sopitas con galletas saladas y gradualmente llegar a las comidas solidas.  3.  Por favor avise a su medico inmediatamente si usted tiene algun sangrado anormal,        tiene dificultad con la respiracion, enrojecimiento y Social research officer, government en el sitio de la cirugia, [place],       fiebro o dolor que se alivia con [place].  4.  A) Su visita posoperatoria (despues de su operacion) es con el          B)  Por favor llame para hacer la cita posoperatoria.  5.  Istrucciones especificas :

## 2022-06-10 NOTE — Anesthesia Preprocedure Evaluation (Addendum)
Anesthesia Evaluation  Patient identified by MRN, date of birth, ID band Patient awake    Reviewed: Allergy & Precautions, NPO status , Patient's Chart, lab work & pertinent test results  History of Anesthesia Complications Negative for: history of anesthetic complications  Airway Mallampati: I   Neck ROM: Full    Dental  (+) Implants   Pulmonary former smoker (quit greater than 5 years ago)   Pulmonary exam normal breath sounds clear to auscultation       Cardiovascular Exercise Tolerance: Good negative cardio ROS Normal cardiovascular exam Rhythm:Regular Rate:Normal  ECG 04/20/22: normal   Neuro/Psych Seizures - (febrile at 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8; another at age 68 associated with hernia surgery),     GI/Hepatic negative GI ROS,,,  Endo/Other  Obesity   Renal/GU negative Renal ROS     Musculoskeletal   Abdominal   Peds  Hematology negative hematology ROS (+)   Anesthesia Other Findings   Reproductive/Obstetrics                             Anesthesia Physical Anesthesia Plan  ASA: 2  Anesthesia Plan: General   Post-op Pain Management:    Induction: Intravenous  PONV Risk Score and Plan: 3 and Ondansetron, Dexamethasone and Treatment may vary due to age or medical condition  Airway Management Planned: Oral ETT  Additional Equipment:   Intra-op Plan:   Post-operative Plan: Extubation in OR  Informed Consent: I have reviewed the patients History and Physical, chart, labs and discussed the procedure including the risks, benefits and alternatives for the proposed anesthesia with the patient or authorized representative who has indicated his/her understanding and acceptance.     Dental advisory given  Plan Discussed with: CRNA  Anesthesia Plan Comments: (Spanish interpreter at bedside for history and consent.  Patient consented for risks of anesthesia including but not limited to:  - adverse  reactions to medications - damage to eyes, teeth, lips or other oral mucosa - nerve damage due to positioning  - sore throat or hoarseness - damage to heart, brain, nerves, lungs, other parts of body or loss of life  Informed patient about role of CRNA in peri- and intra-operative care.  Patient voiced understanding.)        Anesthesia Quick Evaluation

## 2022-06-10 NOTE — Anesthesia Procedure Notes (Signed)
Procedure Name: Intubation Date/Time: 06/10/2022 12:40 PM  Performed by: Hedda Slade, CRNAPre-anesthesia Checklist: Patient identified, Patient being monitored, Timeout performed, Emergency Drugs available and Suction available Patient Re-evaluated:Patient Re-evaluated prior to induction Oxygen Delivery Method: Circle system utilized Preoxygenation: Pre-oxygenation with 100% oxygen Induction Type: IV induction Ventilation: Mask ventilation without difficulty and Oral airway inserted - appropriate to patient size Laryngoscope Size: 3 and McGraph Grade View: Grade I Tube type: Oral Tube size: 6.5 mm Number of attempts: 1 Airway Equipment and Method: Stylet Placement Confirmation: ETT inserted through vocal cords under direct vision, positive ETCO2 and breath sounds checked- equal and bilateral Secured at: 20 cm Tube secured with: Tape Dental Injury: Teeth and Oropharynx as per pre-operative assessment

## 2022-06-11 ENCOUNTER — Encounter: Payer: Self-pay | Admitting: Surgery

## 2022-06-11 NOTE — Anesthesia Postprocedure Evaluation (Signed)
Anesthesia Post Note  Patient: Danielle Ball  Procedure(s) Performed: XI ROBOTIC ASSISTED LAPAROSCOPIC CHOLECYSTECTOMY (Abdomen) INDOCYANINE GREEN FLUORESCENCE IMAGING (ICG) (Abdomen) HERNIA REPAIR UMBILICAL ADULT, OPEN (Abdomen)  Patient location during evaluation: PACU Anesthesia Type: General Level of consciousness: awake and alert Pain management: pain level controlled Vital Signs Assessment: post-procedure vital signs reviewed and stable Respiratory status: spontaneous breathing, nonlabored ventilation and respiratory function stable Cardiovascular status: blood pressure returned to baseline and stable Postop Assessment: no apparent nausea or vomiting Anesthetic complications: no   No notable events documented.   Last Vitals:  Vitals:   06/10/22 1600 06/10/22 1616  BP: 114/76 114/74  Pulse: 64 72  Resp: 17 17  Temp:  (!) 36.1 C  SpO2: 98% 100%    Last Pain:  Vitals:   06/10/22 1616  TempSrc: Temporal  PainSc: [place]

## 2022-06-12 LAB — SURGICAL PATHOLOGY

## 2022-06-24 ENCOUNTER — Ambulatory Visit (INDEPENDENT_AMBULATORY_CARE_PROVIDER_SITE_OTHER): Payer: Self-pay | Admitting: Surgery

## 2022-06-24 ENCOUNTER — Encounter: Payer: Self-pay | Admitting: Surgery

## 2022-06-24 VITALS — BP 95/55 | HR 62 | Temp 98.0°F | Ht 59.0 in | Wt 167.0 lb

## 2022-06-24 DIAGNOSIS — K8020 Calculus of gallbladder without cholecystitis without obstruction: Secondary | ICD-10-CM

## 2022-06-24 DIAGNOSIS — Z09 Encounter for follow-up examination after completed treatment for conditions other than malignant neoplasm: Secondary | ICD-10-CM

## 2022-06-24 NOTE — Patient Instructions (Signed)
Colecistostoma, cuidados posteriores Cholecystostomy, Care After La siguiente informacin ofrece orientacin sobre cmo cuidarse despus del procedimiento. El mdico tambin podr darle instrucciones ms especficas. Comunquese con el mdico si tiene problemas o preguntas. Qu puedo esperar despus del procedimiento? Despus del procedimiento, es comn sentir dolor cerca del lugar de la incisin para el tubo de drenaje (catter). Tambin es posible que salga una pequea cantidad de lquido o sangre del lugar de la incisin para Print production planner. Siga estas instrucciones en su casa: Cuidado de la incisin  Siga las instrucciones del mdico acerca de cmo cuidar el lugar de la incisin donde se insert el catter. Asegrese de hacer lo siguiente: Lvese las manos con agua y jabn durante al menos 20 segundos antes y despus de cambiarse la venda (vendaje). Use desinfectante para manos si no dispone de [place]. Cambie el vendaje como se lo haya indicado el mdico. Controle todos los das el lugar de la incisin para el catter a fin de Hydrographic surveyor signos de infeccin. Est atento a los siguientes signos: Enrojecimiento, [place]. Ms lquido Delorise Shiner. Calor. Pus o mal olor. No tome baos de inmersin, no nade ni use el jacuzzi hasta que el mdico lo autorice. Pregntele al mdico si puede ducharse. Thurston Pounds solo le permitan darse baos de [place]. Instrucciones generales Siga las instrucciones del mdico acerca de cmo debe cuidar el catter y la bolsa de recoleccin. El mdico le mostrar lo siguiente: Press photographer la cantidad de drenaje del catter. Cmo lavar el catter. Cmo cuidar el lugar de la incisin para Print production planner. Siga las instrucciones del mdico respecto de las restricciones en las comidas o las bebidas. Use los medicamentos de venta libre y los recetados solamente como se lo haya indicado el mdico. Concurra a todas las visitas de seguimiento. Esto es  importante. Comunquese con un mdico si: Tiene fiebre. El Management consultant de la incisin empeora o no mejora con medicamentos. Tiene enrojecimiento, hinchazn o dolor alrededor del lugar de la incisin para Print production planner. Aumenta la cantidad de lquido o sangre que salen del lugar de la incisin para Print production planner. Tiene nuseas o vmitos. Solicite ayuda de inmediato si: Siente mareos o se desmaya mientras est de pie. El lugar de la incisin para el catter se siente caliente al tacto. Tiene pus o percibe mal olor que proviene del lugar de incisin para Print production planner. Le falta el aire. Tiene latidos cardacos acelerados. El catter se obstruye. El catter se sale del abdomen. Estos sntomas pueden Sales executive. Solicite ayuda de inmediato. Llame al 911. No espere a ver si los sntomas desaparecen. No conduzca por sus propios medios [employer] [facility]. No conduzca por sus propios medios [employer]. Resumen Despus del procedimiento, es comn sentir dolor cerca del lugar de la incisin para Print production planner. Cambie el vendaje como se lo haya indicado el mdico. Controle todos los das el lugar de la incisin para el catter a fin de Hydrographic surveyor signos de infeccin. Verifique si hay enrojecimiento, hinchazn, dolor, lquido, sangre, pus, calor o mal olor. Siga las instrucciones del mdico respecto de las restricciones en las comidas o las bebidas. Esta informacin no tiene Marine scientist el consejo del mdico. Asegrese de hacerle al mdico cualquier pregunta que tenga. Document Revised: 11/06/2020 Document Reviewed: 11/06/2020 Elsevier Patient Education  [place].

## 2022-06-24 NOTE — Progress Notes (Signed)
06/24/2022  HPI: Danielle Ball is a 32 y.o. female s/p robotic assisted cholecystectomy on 06/10/22.  She presents for follow up.  She's been doing well, with improving pain.  There's some discomfort at the umbilicus and RUQ but has been improving.  Tolerating diet, denies any nausea.  Vital signs: BP (!) 95/55   Pulse 62   Temp 98 F (36.7 C)   Ht '4\' 11"'$  (1.499 m)   Wt 167 lb (75.8 kg)   LMP 05/25/2022 (Exact Date)   SpO2 99%   BMI 33.73 kg/m    Physical Exam: Constitutional:  No acute distress Abdomen:  Soft, non-distended, non-tender to palpation, with only minimal discomfort at the umbilicus and RUQ on palpation.  Incisions healing well, and are clean, dry, and intact with dermabond in place still.  Assessment/Plan: This is a 32 y.o. female s/p robotic assisted cholecystectomy  --Patient is healing well.  Discussed the discomfort is likely from the inflammation or scarring that's happening.  This will improve with time. --Discussed being able to advance her diet and try some of her normal foods.   --Follow up as needed.   Melvyn Neth, [facility]
